# Patient Record
Sex: Male | Born: 1975
Health system: Southern US, Community
[De-identification: ages and names within clinical notes are randomized; demographics above are authoritative.]

## PROBLEM LIST (undated history)

## (undated) DIAGNOSIS — Z955 Presence of coronary angioplasty implant and graft: Secondary | ICD-10-CM

## (undated) DIAGNOSIS — E669 Obesity, unspecified: Secondary | ICD-10-CM

## (undated) DIAGNOSIS — I219 Acute myocardial infarction, unspecified: Secondary | ICD-10-CM

## (undated) DIAGNOSIS — F1910 Other psychoactive substance abuse, uncomplicated: Secondary | ICD-10-CM

## (undated) DIAGNOSIS — I2510 Atherosclerotic heart disease of native coronary artery without angina pectoris: Secondary | ICD-10-CM

## (undated) DIAGNOSIS — I5189 Other ill-defined heart diseases: Secondary | ICD-10-CM

## (undated) DIAGNOSIS — I10 Essential (primary) hypertension: Secondary | ICD-10-CM

## (undated) DIAGNOSIS — E785 Hyperlipidemia, unspecified: Secondary | ICD-10-CM

## (undated) HISTORY — DX: Hyperlipidemia, unspecified: E78.5

## (undated) HISTORY — DX: Other ill-defined heart diseases: I51.89

## (undated) HISTORY — DX: Acute myocardial infarction, unspecified: I21.9

## (undated) HISTORY — DX: Atherosclerotic heart disease of native coronary artery without angina pectoris: I25.10

---

## 2008-02-14 ENCOUNTER — Emergency Department: Payer: Self-pay | Admitting: Emergency Medicine

## 2008-02-14 ENCOUNTER — Other Ambulatory Visit: Payer: Self-pay

## 2009-03-11 IMAGING — CR DG CHEST 1V PORT
1 series · 1 of 1 positions shown · non-contrast
Comparison: none

REASON FOR EXAM: Chest pain
COMMENTS:

PROCEDURE:     DXR - DXR PORTABLE CHEST SINGLE VIEW  - February 14, 2008  [DATE]
RESULT:     Frontal view of the chest is performed.
The lungs are clear. The cardiac silhouette and visualized bony skeleton are
unremarkable.

[view not recorded]
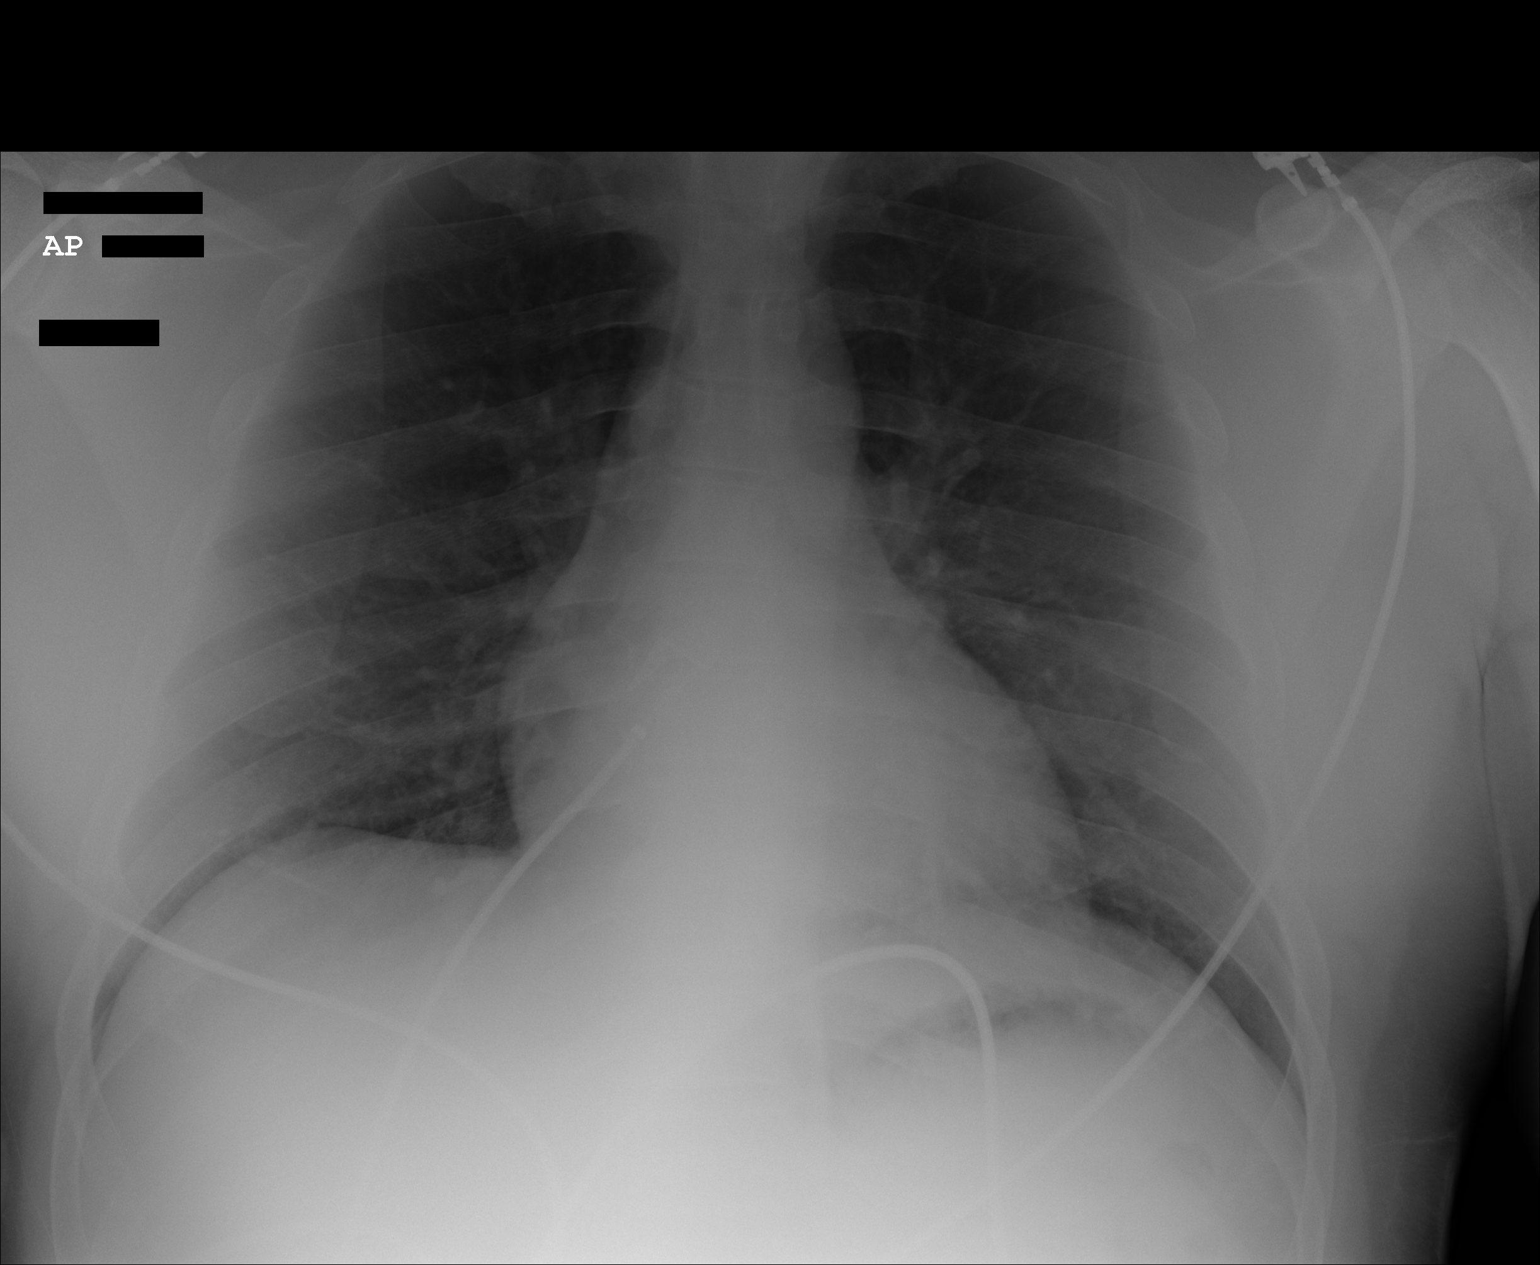

[1 of 1 positions shown; findings below may reference images not displayed]

IMPRESSION: Chest radiograph without evidence of acute cardiopulmonary
disease.

## 2015-08-18 DIAGNOSIS — I2510 Atherosclerotic heart disease of native coronary artery without angina pectoris: Secondary | ICD-10-CM

## 2015-08-18 HISTORY — DX: Atherosclerotic heart disease of native coronary artery without angina pectoris: I25.10

## 2015-08-25 ENCOUNTER — Emergency Department: Payer: Medicaid Other

## 2015-08-25 ENCOUNTER — Inpatient Hospital Stay
Admission: EM | Admit: 2015-08-25 | Discharge: 2015-08-28 | DRG: 246 | Disposition: A | Discharge status: Home / Self Care | Payer: Medicaid Other | Attending: Internal Medicine | Admitting: Internal Medicine

## 2015-08-25 ENCOUNTER — Encounter: Payer: Self-pay | Admitting: Emergency Medicine

## 2015-08-25 DIAGNOSIS — M549 Dorsalgia, unspecified: Secondary | ICD-10-CM | POA: Diagnosis present

## 2015-08-25 DIAGNOSIS — F17210 Nicotine dependence, cigarettes, uncomplicated: Secondary | ICD-10-CM | POA: Diagnosis present

## 2015-08-25 DIAGNOSIS — Z9114 Patient's other noncompliance with medication regimen: Secondary | ICD-10-CM | POA: Diagnosis present

## 2015-08-25 DIAGNOSIS — I2510 Atherosclerotic heart disease of native coronary artery without angina pectoris: Secondary | ICD-10-CM | POA: Diagnosis not present

## 2015-08-25 DIAGNOSIS — I10 Essential (primary) hypertension: Secondary | ICD-10-CM | POA: Diagnosis present

## 2015-08-25 DIAGNOSIS — F17200 Nicotine dependence, unspecified, uncomplicated: Secondary | ICD-10-CM | POA: Insufficient documentation

## 2015-08-25 DIAGNOSIS — G8929 Other chronic pain: Secondary | ICD-10-CM | POA: Diagnosis present

## 2015-08-25 DIAGNOSIS — F1290 Cannabis use, unspecified, uncomplicated: Secondary | ICD-10-CM | POA: Diagnosis present

## 2015-08-25 DIAGNOSIS — I25119 Atherosclerotic heart disease of native coronary artery with unspecified angina pectoris: Secondary | ICD-10-CM | POA: Diagnosis present

## 2015-08-25 DIAGNOSIS — Z823 Family history of stroke: Secondary | ICD-10-CM | POA: Diagnosis not present

## 2015-08-25 DIAGNOSIS — I214 Non-ST elevation (NSTEMI) myocardial infarction: Principal | ICD-10-CM | POA: Diagnosis present

## 2015-08-25 DIAGNOSIS — I209 Angina pectoris, unspecified: Secondary | ICD-10-CM | POA: Diagnosis not present

## 2015-08-25 DIAGNOSIS — R000 Tachycardia, unspecified: Secondary | ICD-10-CM | POA: Diagnosis present

## 2015-08-25 DIAGNOSIS — Z6833 Body mass index (BMI) 33.0-33.9, adult: Secondary | ICD-10-CM

## 2015-08-25 DIAGNOSIS — E669 Obesity, unspecified: Secondary | ICD-10-CM | POA: Diagnosis present

## 2015-08-25 DIAGNOSIS — R079 Chest pain, unspecified: Secondary | ICD-10-CM | POA: Diagnosis not present

## 2015-08-25 DIAGNOSIS — E785 Hyperlipidemia, unspecified: Secondary | ICD-10-CM | POA: Diagnosis present

## 2015-08-25 DIAGNOSIS — Z8249 Family history of ischemic heart disease and other diseases of the circulatory system: Secondary | ICD-10-CM | POA: Diagnosis not present

## 2015-08-25 DIAGNOSIS — Z720 Tobacco use: Secondary | ICD-10-CM | POA: Diagnosis not present

## 2015-08-25 HISTORY — DX: Other psychoactive substance abuse, uncomplicated: F19.10

## 2015-08-25 HISTORY — DX: Obesity, unspecified: E66.9

## 2015-08-25 HISTORY — DX: Essential (primary) hypertension: I10

## 2015-08-25 LAB — BASIC METABOLIC PANEL
ANION GAP: 11 (ref 5–15)
BUN: 17 mg/dL (ref 6–20)
CHLORIDE: 98 mmol/L — AB (ref 101–111)
CO2: 27 mmol/L (ref 22–32)
Calcium: 9.9 mg/dL (ref 8.9–10.3)
Creatinine, Ser: 1.37 mg/dL — ABNORMAL HIGH (ref 0.61–1.24)
GFR calc Af Amer: >60 mL/min (ref >60)
GFR calc non Af Amer: >60 mL/min (ref >60)
Glucose, Bld: 148 mg/dL — ABNORMAL HIGH (ref 65–99)
POTASSIUM: 3.6 mmol/L (ref 3.5–5.1)
SODIUM: 136 mmol/L (ref 135–145)

## 2015-08-25 LAB — CBC
HCT: 48.7 % (ref 40.0–52.0)
HEMOGLOBIN: 15.6 g/dL (ref 13.0–18.0)
MCH: 27.6 pg (ref 26.0–34.0)
MCHC: 32.2 g/dL (ref 32.0–36.0)
MCV: 85.9 fL (ref 80.0–100.0)
PLATELETS: 223 10*3/uL (ref 150–440)
RBC: 5.67 MIL/uL (ref 4.40–5.90)
RDW: 14.4 % (ref 11.5–14.5)
WBC: 6.6 10*3/uL (ref 3.8–10.6)

## 2015-08-25 LAB — PROTIME-INR
INR: 0.91
PROTHROMBIN TIME: 12.5 s (ref 11.4–15.0)

## 2015-08-25 LAB — TROPONIN I: TROPONIN I: 4.92 ng/mL — AB (ref <0.031)

## 2015-08-25 LAB — APTT: APTT: 28 s (ref 24–36)

## 2015-08-25 MED ORDER — ONDANSETRON HCL 4 MG/2ML IJ SOLN: 4.0000 mg | Freq: Four times a day (QID) | INTRAMUSCULAR | Status: DC | PRN

## 2015-08-25 MED ORDER — ASPIRIN 300 MG RE SUPP: 300.0000 mg | RECTAL | Status: DC

## 2015-08-25 MED ORDER — ASPIRIN 81 MG PO CHEW
324.0000 mg | CHEWABLE_TABLET | Freq: Once | ORAL | Status: AC
  Administered 2015-08-25: 324 mg via ORAL
  Filled 2015-08-25: qty 4

## 2015-08-25 MED ORDER — METOPROLOL TARTRATE 25 MG PO TABS
12.5000 mg | ORAL_TABLET | Freq: Two times a day (BID) | ORAL | Status: DC
  Administered 2015-08-26: 12.5 mg via ORAL
  Filled 2015-08-25: qty 1

## 2015-08-25 MED ORDER — ASPIRIN EC 81 MG PO TBEC: 81.0000 mg | DELAYED_RELEASE_TABLET | Freq: Every day | ORAL | Status: DC

## 2015-08-25 MED ORDER — ASPIRIN 81 MG PO CHEW: 324.0000 mg | CHEWABLE_TABLET | ORAL | Status: DC

## 2015-08-25 MED ORDER — ACETAMINOPHEN 325 MG PO TABS: 650.0000 mg | ORAL_TABLET | ORAL | Status: DC | PRN

## 2015-08-25 MED ORDER — MORPHINE SULFATE (PF) 2 MG/ML IV SOLN
2.0000 mg | INTRAVENOUS | Status: DC | PRN
  Administered 2015-08-26 – 2015-08-27 (×2): 2 mg via INTRAVENOUS
  Filled 2015-08-25: qty 1

## 2015-08-25 MED ORDER — HYDRALAZINE HCL 20 MG/ML IJ SOLN
10.0000 mg | INTRAMUSCULAR | Status: DC | PRN
  Administered 2015-08-26: 20 mg via INTRAVENOUS
  Administered 2015-08-27: 10 mg via INTRAVENOUS
  Filled 2015-08-25: qty 1

## 2015-08-25 MED ORDER — NITROGLYCERIN 2 % TD OINT
1.0000 [in_us] | TOPICAL_OINTMENT | Freq: Once | TRANSDERMAL | Status: AC
  Administered 2015-08-25: 1 [in_us] via TOPICAL
  Filled 2015-08-25: qty 1

## 2015-08-25 MED ORDER — HEPARIN (PORCINE) IN NACL 100-0.45 UNIT/ML-% IJ SOLN
1300.0000 [IU]/h | INTRAMUSCULAR | Status: DC
  Administered 2015-08-26: 1300 [IU]/h via INTRAVENOUS
  Filled 2015-08-25: qty 250

## 2015-08-25 MED ORDER — HEPARIN BOLUS VIA INFUSION
4000.0000 [IU] | Freq: Once | INTRAVENOUS | Status: AC
Start: 2015-08-25 — End: 2015-08-26
  Administered 2015-08-26: 4000 [IU] via INTRAVENOUS
  Filled 2015-08-25: qty 4000

## 2015-08-25 MED ORDER — ATORVASTATIN CALCIUM 20 MG PO TABS
80.0000 mg | ORAL_TABLET | Freq: Every day | ORAL | Status: DC
  Administered 2015-08-26 – 2015-08-27 (×2): 80 mg via ORAL
  Filled 2015-08-25 (×3): qty 4
  Filled 2015-08-25: qty 3

## 2015-08-25 MED ORDER — LABETALOL HCL 5 MG/ML IV SOLN
10.0000 mg | Freq: Once | INTRAVENOUS | Status: AC
  Administered 2015-08-25: 10 mg via INTRAVENOUS
  Filled 2015-08-25: qty 4

## 2015-08-25 MED ORDER — NITROGLYCERIN 0.4 MG SL SUBL: 0.4000 mg | SUBLINGUAL_TABLET | SUBLINGUAL | Status: DC | PRN

## 2015-08-25 NOTE — ED Notes (Signed)
Patient c/o left sided chest pain x 3 days with tingling down left arm. Patient reports has had intermittent chest discomfort with some shortness of breath and diaphoretic at times. Patient reports history of HTN but does not have a PCP nor medicine for blood pressure. Patient is current smoker. Denies headache, blurry vision, dizziness, N/V.  Patient alert and oriented x 4, respirations even and unlabored. Placed on cardiac monitor.

## 2015-08-25 NOTE — H&P (Signed)
[facility] - [place] at [facility]   PATIENT NAME: George Welch    MR#:  [id number]  DATE OF BIRTH:  January 22, 1976   DATE OF ADMISSION:  08/25/2015  PRIMARY CARE PHYSICIAN: No PCP Per Patient   REQUESTING/REFERRING PHYSICIAN: Carollee Massed  CHIEF COMPLAINT:   Chief Complaint  Patient presents with  . Chest Pain    HISTORY OF PRESENT ILLNESS:  George Welch  is a 39 y.o. male with a known history of essential hypertension noncompliant with medication presenting with chest pain. He describes chest pain intermittent over the last 3 days not exertional retrosternal without radiation, associated shortness of breath and diaphoresis intensity worsening or last 3 days thus prompting presentation at the hospital. Noted to be hypertensive on arrival as well as markedly elevated troponin.  PAST MEDICAL HISTORY:   Past Medical History  Diagnosis Date  . Hypertension     PAST SURGICAL HISTORY:  History reviewed. No pertinent past surgical history.  SOCIAL HISTORY:   Social History  Substance Use Topics  . Smoking status: Current Every Day Smoker  . Smokeless tobacco: Not on file  . Alcohol Use: Not on file    FAMILY HISTORY:   Family History  Problem Relation Age of Onset  . Heart failure Neg Hx     DRUG ALLERGIES:  No Known Allergies  REVIEW OF SYSTEMS:  REVIEW OF SYSTEMS:  CONSTITUTIONAL: Denies fevers, chills, fatigue, weakness.  EYES: Denies blurred vision, double vision, or eye pain.  EARS, NOSE, THROAT: Denies tinnitus, ear pain, hearing loss.  RESPIRATORY: denies cough, positive shortness of breath, denies wheezing  CARDIOVASCULAR: Positive chest pain, denies palpitations, edema.  GASTROINTESTINAL: Denies nausea, vomiting, diarrhea, abdominal pain.  GENITOURINARY: Denies dysuria, hematuria.  ENDOCRINE: Denies nocturia or thyroid problems. HEMATOLOGIC AND LYMPHATIC: Denies easy bruising or bleeding.  SKIN: Denies rash or lesions.   MUSCULOSKELETAL: Denies pain in neck, back, shoulder, knees, hips, or further arthritic symptoms.  NEUROLOGIC: Denies paralysis, paresthesias.  PSYCHIATRIC: Denies anxiety or depressive symptoms. Otherwise full review of systems performed by me is negative.   MEDICATIONS AT HOME:   Prior to Admission medications   Not on File      VITAL SIGNS:  Blood pressure 154/109, pulse 98, temperature 99.2 F (37.3 C), temperature source Oral, resp. rate 20, height 5\' 11"  (1.803 m), weight 241 lb (109.317 kg), SpO2 97 %.  PHYSICAL EXAMINATION:  VITAL SIGNS: Filed Vitals:   08/25/15 2315  BP: 154/109  Pulse: 98  Temp:   Resp: 20   GENERAL:39 y.o.male currently in no acute distress.  HEAD: Normocephalic, atraumatic.  EYES: Pupils equal, round, reactive to light. Extraocular muscles intact. No scleral icterus.  MOUTH: Moist mucosal membrane. Dentition intact. No abscess noted.  EAR, NOSE, THROAT: Clear without exudates. No external lesions.  NECK: Supple. No thyromegaly. No nodules. No JVD.  PULMONARY: Clear to ascultation, without wheeze rails or rhonci. No use of accessory muscles, Good respiratory effort. good air entry bilaterally CHEST: Nontender to palpation.  CARDIOVASCULAR: S1 and S2. Regular rate and rhythm. No murmurs, rubs, or gallops. No edema. Pedal pulses 2+ bilaterally.  GASTROINTESTINAL: Soft, nontender, nondistended. No masses. Positive bowel sounds. No hepatosplenomegaly.  MUSCULOSKELETAL: No swelling, clubbing, or edema. Range of motion full in all extremities.  NEUROLOGIC: Cranial nerves II through XII are intact. No gross focal neurological deficits. Sensation intact. Reflexes intact.  SKIN: No ulceration, lesions, rashes, or cyanosis. Skin warm and dry. Turgor intact.  PSYCHIATRIC: Mood, affect within normal limits.  The patient is awake, alert and oriented x 3. Insight, judgment intact.    LABORATORY PANEL:   CBC  Recent Labs Lab 08/25/15 2131  WBC 6.6  HGB  15.6  HCT 48.7  PLT 223   ------------------------------------------------------------------------------------------------------------------  Chemistries   Recent Labs Lab 08/25/15 2131  NA 136  K 3.6  CL 98*  CO2 27  GLUCOSE 148*  BUN 17  CREATININE 1.37*  CALCIUM 9.9   ------------------------------------------------------------------------------------------------------------------  Cardiac Enzymes  Recent Labs Lab 08/25/15 2131  TROPONINI 4.92*   ------------------------------------------------------------------------------------------------------------------  RADIOLOGY:  Dg Chest Port 1 View  08/25/2015   CLINICAL DATA:  Chest pain for 2 days.  Shortness of breath.  EXAM: PORTABLE CHEST - 1 VIEW  COMPARISON:  None.  FINDINGS: The heart size and mediastinal contours are within normal limits. Both lungs are clear. The visualized skeletal structures are unremarkable.  IMPRESSION: No active disease.   Electronically Signed   By: Burman Nieves M.D.   On: 08/25/2015 22:48    EKG:   Orders placed or performed during the hospital encounter of 08/25/15  . ED EKG  . ED EKG    IMPRESSION AND PLAN:   39 year old African-American gentleman history of essential hypertension presenting with chest pain.  1. NSTEMI:  Initiate aspirin and statin therapy, admitted to telemetry, trend cardiac enzymes 3, heparin drip ,nitroglycerin when necessary, morphine when necessary, consult cardiology  2. Essential hypertension: Add when necessary hydralazine, as her received 1 dose of IV labetalol 3. Venous from embolism prophylactic: Therapeutic heparin    All the records are reviewed and case discussed with ED provider. Management plans discussed with the patient, family and they are in agreement.  CODE STATUS: Full  TOTAL TIME TAKING CARE OF THIS PATIENT: 45 minutes.    Bridget Westbrooks,  Mardi Mainland.D on 08/25/2015 at 11:31 PM  Between 7am to 6pm - Pager - [phone]  After 6pm: House  Pager: - [phone]  [facility]  Office  [phone]  CC: Primary care physician; No PCP Per Patient

## 2015-08-25 NOTE — ED Notes (Signed)
MD at bedside. 

## 2015-08-25 NOTE — ED Notes (Signed)
Chest pain x 2 days, has has cp, shob along with it.

## 2015-08-25 NOTE — ED Provider Notes (Signed)
[facility] Emergency Department Provider Note  ____________________________________________  Time seen: 22 15  I have reviewed the triage vital signs and the nursing notes.   HISTORY  Chief Complaint Chest Pain     HPI George Welch is a 39 y.o. male with a history of untreated hypertension presents to the emergency department reporting chest discomfort for the past 2-3 days.  This pain has been in the mid chest with some radiation and funny feelings into his left arm. He reports some shortness of breath and some episodes of diaphoresis over the past 2 days.  As noted above, he has a history of untreated hypertension. He reports having had similar episodes of chest discomfort in the past and he has been seen in the emergency department and told that it was his blood pressure. The last time this occurred was greater than a year ago. He has no known history of any myocardial infarctions.   The patient does smoke cigarettes. He denies any drug use, including any use of crack or cocaine.    Past Medical History  Diagnosis Date  . Hypertension     Past medical history Hypertension  History reviewed. No pertinent past surgical history.  No current outpatient prescriptions on file.  Allergies Review of patient's allergies indicates no known allergies.  No family history on file.  Social History Social History  Substance Use Topics  . Smoking status: Current Every Day Smoker  . Smokeless tobacco: None  . Alcohol Use: None    Review of Systems  Constitutional: Negative for fever. ENT: Negative for sore throat. Cardiovascular:  Positive for chest pain. Respiratory:  Some shortness of breath. Gastrointestinal: Negative for abdominal pain, vomiting and diarrhea. Genitourinary: Negative for dysuria. Musculoskeletal: No myalgias or injuries. Skin:  Diaphoresis noted. Otherwise negative. Neurological: Negative for headaches   10-point ROS  otherwise negative.  ____________________________________________   PHYSICAL EXAM:  VITAL SIGNS: ED Triage Vitals  Enc Vitals Group     BP 08/25/15 2128 163/104 mmHg     Pulse --      Resp 08/25/15 2128 111     Temp 08/25/15 2128 99.2 F (37.3 C)     Temp Source 08/25/15 2128 Oral     SpO2 08/25/15 2128 97 %     Weight 08/25/15 2128 241 lb (109.317 kg)     Height 08/25/15 2128  (1.803 m)     Head Cir --      Peak Flow --      Pain Score 08/25/15 2128 0     Pain Loc --      Pain Edu? --      Excl. in GC? --     Constitutional:  Alert and oriented. Well appearing and in no distress. ENT   Head: Normocephalic and atraumatic.   Nose: No congestion/rhinnorhea.   Mouth/Throat: Mucous membranes are moist. Cardiovascular: Tachycardic at 105. regular rhythm, no murmur noted Respiratory:  Normal respiratory effort, no tachypnea.    Breath sounds are clear and equal bilaterally.  Gastrointestinal: Soft and nontender. No distention.  Back: No muscle spasm, no tenderness, no CVA tenderness. Musculoskeletal: No deformity noted. Nontender with normal range of motion in all extremities.  No noted edema. Neurologic:  Normal speech and language. No gross focal neurologic deficits are appreciated.  Skin:  Skin is warm, moist with mild diaphoresis. No rash noted. Psychiatric: Mood and affect are normal. Speech and behavior are normal.  ____________________________________________    LABS (pertinent positives/negatives)  Labs Reviewed  BASIC METABOLIC PANEL - Abnormal; Notable for the following:    Chloride 98 (*)    Glucose, Bld 148 (*)    Creatinine, Ser 1.37 (*)    All other components within normal limits  TROPONIN I - Abnormal; Notable for the following:    Troponin I 4.92 (*)    All other components within normal limits  CBC  APTT  PROTIME-INR     ____________________________________________   EKG  ED ECG REPORT I, Laden Fieldhouse W, the attending  physician, personally viewed and interpreted this ECG.   Date: 08/25/2015  EKG Time: 2127  Rate: 103  Rhythm: Sinus tachycardia Minimal voltage criteria for left ventricular hypertrophy. Q in lead 3 and aVF  Axis: Left axis at -21  Intervals: Normal  ST&T Change: None noted   ____________________________________________    RADIOLOGY  Chest x-ray:  FINDINGS: The heart size and mediastinal contours are within normal limits. Both lungs are clear. The visualized skeletal structures are unremarkable.  IMPRESSION: No active disease. ____________________________________________   PROCEDURES  CRITICAL CARE Performed by: Darien Ramus   Total critical care time: 35 minutes due to the critical nature of this patient's subacute or acute myocardial infarction, requiring anticoagulant, IV blood pressure medication, discussion with cardiology and with internal medicine, and discussion with the patient's wife by telephone.  Critical care time was exclusive of separately billable procedures and treating other patients.  Critical care was necessary to treat or prevent imminent or life-threatening deterioration.  Critical care was time spent personally by me on the following activities: development of treatment plan with patient and/or surrogate as well as nursing, discussions with consultants, evaluation of patient's response to treatment, examination of patient, obtaining history from patient or surrogate, ordering and performing treatments and interventions, ordering and review of laboratory studies, ordering and review of radiographic studies, pulse oximetry and re-evaluation of patient's condition.   ____________________________________________   INITIAL IMPRESSION / ASSESSMENT AND PLAN / ED COURSE  Pertinent labs & imaging results that were available during my care of the patient were reviewed by me and considered in my medical decision making (see chart for  details).  39 year old male with chest discomfort, now noted to have an elevated troponin at 4.9. This patient's EKG does not show any specific acute changes, it is not a STEMI. I have called and spoken with Dr. Elease Hashimoto, cardiology, and he agrees with my current actions to treat with heparin, aspirin, or blocker, and nitroglycerin. The patient will be admitted to the hospital.  After speaking with the patient, and with his consent, I've called and spoken with the patient's wife by telephone. She reports she will return to the hospital send.  ----------------------------------------- 10:46 PM on 08/25/2015 -----------------------------------------  I spoke with Dr. Clint Guy for admission to the hospital. ____________________________________________   FINAL CLINICAL IMPRESSION(S) / ED DIAGNOSES  Final diagnoses:  Ischemic chest pain  Acute non-ST-elevation myocardial infarction  Essential hypertension      Darien Ramus, MD 08/25/15 2258

## 2015-08-25 NOTE — Progress Notes (Signed)
ANTICOAGULATION CONSULT NOTE - Initial Consult  Pharmacy Consult for heparin drip Indication: ACS/STEMI  No Known Allergies  Patient Measurements: Height:  (180.3 cm) Weight: 241 lb (109.317 kg) IBW/kg (Calculated) : 75.3 Heparin Dosing Weight: 99kg  Vital Signs: Temp: 99.2 F (37.3 C) (09/08 2128) Temp Source: Oral (09/08 2128) BP: 175/106 mmHg (09/08 2230) Pulse Rate: 109 (09/08 2230)  Labs:  Recent Labs  08/25/15 2131  HGB 15.6  HCT 48.7  PLT 223  APTT 28  LABPROT 12.5  INR 0.91  CREATININE 1.37*  TROPONINI 4.92*    Estimated Creatinine Clearance: 91 mL/min (by C-G formula based on Cr of 1.37).   Medical History: Past Medical History  Diagnosis Date  . Hypertension     Medications:    Assessment: Hgb 15.6  plt 223 INR 0.91  aPTT 28  Goal of Therapy:  Heparin level 0.3-0.7 units/ml Monitor platelets by anticoagulation protocol: Yes   Plan:  4000 unit bolus and initial rate of 1300 units/hr. First anti-Xa 6 hours after start of infusion.  George Welch 08/25/2015,11:21 PM

## 2015-08-26 ENCOUNTER — Inpatient Hospital Stay: Admit: 2015-08-26 | Payer: Medicaid Other

## 2015-08-26 ENCOUNTER — Inpatient Hospital Stay (HOSPITAL_COMMUNITY)
Admit: 2015-08-26 | Discharge: 2015-08-26 | Disposition: A | Discharge status: Home / Self Care | Payer: Medicaid Other | Attending: Internal Medicine | Admitting: Internal Medicine

## 2015-08-26 ENCOUNTER — Encounter
Admission: EM | Disposition: A | Discharge status: Home / Self Care | Payer: Self-pay | Source: Home / Self Care | Attending: Internal Medicine

## 2015-08-26 ENCOUNTER — Encounter: Payer: Self-pay | Admitting: Internal Medicine

## 2015-08-26 DIAGNOSIS — F17200 Nicotine dependence, unspecified, uncomplicated: Secondary | ICD-10-CM | POA: Insufficient documentation

## 2015-08-26 DIAGNOSIS — I214 Non-ST elevation (NSTEMI) myocardial infarction: Secondary | ICD-10-CM | POA: Insufficient documentation

## 2015-08-26 DIAGNOSIS — I2510 Atherosclerotic heart disease of native coronary artery without angina pectoris: Secondary | ICD-10-CM

## 2015-08-26 DIAGNOSIS — Z720 Tobacco use: Secondary | ICD-10-CM

## 2015-08-26 DIAGNOSIS — I209 Angina pectoris, unspecified: Secondary | ICD-10-CM | POA: Insufficient documentation

## 2015-08-26 DIAGNOSIS — R079 Chest pain, unspecified: Secondary | ICD-10-CM

## 2015-08-26 DIAGNOSIS — I10 Essential (primary) hypertension: Secondary | ICD-10-CM | POA: Insufficient documentation

## 2015-08-26 HISTORY — PX: CARDIAC CATHETERIZATION: SHX172

## 2015-08-26 LAB — CBC
HEMATOCRIT: 47 % (ref 40.0–52.0)
HEMOGLOBIN: 15.5 g/dL (ref 13.0–18.0)
MCH: 28.2 pg (ref 26.0–34.0)
MCHC: 33 g/dL (ref 32.0–36.0)
MCV: 85.6 fL (ref 80.0–100.0)
Platelets: 211 10*3/uL (ref 150–440)
RBC: 5.49 MIL/uL (ref 4.40–5.90)
RDW: 14.4 % (ref 11.5–14.5)
WBC: 6.9 10*3/uL (ref 3.8–10.6)

## 2015-08-26 LAB — LIPID PANEL
CHOL/HDL RATIO: 7.4 ratio
CHOLESTEROL: 250 mg/dL — AB (ref 0–200)
HDL: 34 mg/dL — ABNORMAL LOW (ref >40)
LDL CALC: 159 mg/dL — AB (ref 0–99)
Triglycerides: 283 mg/dL — ABNORMAL HIGH (ref <150)
VLDL: 57 mg/dL — AB (ref 0–40)

## 2015-08-26 LAB — URINE DRUG SCREEN, QUALITATIVE (ARMC ONLY)
AMPHETAMINES, UR SCREEN: NOT DETECTED
Barbiturates, Ur Screen: NOT DETECTED
Benzodiazepine, Ur Scrn: POSITIVE — AB
COCAINE METABOLITE, UR ~~LOC~~: NOT DETECTED
Cannabinoid 50 Ng, Ur ~~LOC~~: POSITIVE — AB
MDMA (ECSTASY) UR SCREEN: NOT DETECTED
Methadone Scn, Ur: NOT DETECTED
OPIATE, UR SCREEN: NOT DETECTED
PHENCYCLIDINE (PCP) UR S: NOT DETECTED
Tricyclic, Ur Screen: NOT DETECTED

## 2015-08-26 LAB — BASIC METABOLIC PANEL
Anion gap: 12 (ref 5–15)
BUN: 15 mg/dL (ref 6–20)
CHLORIDE: 100 mmol/L — AB (ref 101–111)
CO2: 26 mmol/L (ref 22–32)
Calcium: 9.6 mg/dL (ref 8.9–10.3)
Creatinine, Ser: 1.15 mg/dL (ref 0.61–1.24)
GFR calc Af Amer: >60 mL/min (ref >60)
GLUCOSE: 110 mg/dL — AB (ref 65–99)
POTASSIUM: 3.6 mmol/L (ref 3.5–5.1)
Sodium: 138 mmol/L (ref 135–145)

## 2015-08-26 LAB — TROPONIN I
TROPONIN I: 14.43 ng/mL — AB (ref <0.031)
Troponin I: 5.5 ng/mL — ABNORMAL HIGH (ref <0.031)
Troponin I: 6.46 ng/mL — ABNORMAL HIGH (ref <0.031)

## 2015-08-26 LAB — HEMOGLOBIN A1C: HEMOGLOBIN A1C: 5.7 % (ref 4.0–6.0)

## 2015-08-26 LAB — HEPARIN LEVEL (UNFRACTIONATED): Heparin Unfractionated: 0.59 IU/mL (ref 0.30–0.70)

## 2015-08-26 LAB — MRSA PCR SCREENING: MRSA by PCR: NEGATIVE

## 2015-08-26 SURGERY — LEFT HEART CATH AND CORONARY ANGIOGRAPHY
Anesthesia: Moderate Sedation

## 2015-08-26 MED ORDER — NICOTINE 21 MG/24HR TD PT24
21.0000 mg | MEDICATED_PATCH | Freq: Every day | TRANSDERMAL | Status: DC
  Administered 2015-08-26: 21 mg via TRANSDERMAL
  Filled 2015-08-26 (×4): qty 1

## 2015-08-26 MED ORDER — NITROGLYCERIN 5 MG/ML IV SOLN
INTRAVENOUS | Status: AC
  Filled 2015-08-26: qty 10

## 2015-08-26 MED ORDER — ASPIRIN 81 MG PO CHEW
81.0000 mg | CHEWABLE_TABLET | Freq: Every day | ORAL | Status: DC
  Administered 2015-08-27 – 2015-08-28 (×2): 81 mg via ORAL
  Filled 2015-08-26 (×2): qty 1

## 2015-08-26 MED ORDER — TICAGRELOR 90 MG PO TABS
ORAL_TABLET | ORAL | Status: DC | PRN
  Administered 2015-08-26: 180 mg via ORAL

## 2015-08-26 MED ORDER — SODIUM CHLORIDE 0.9 % IJ SOLN: 3.0000 mL | Freq: Two times a day (BID) | INTRAMUSCULAR | Status: DC

## 2015-08-26 MED ORDER — ASPIRIN 81 MG PO CHEW
CHEWABLE_TABLET | ORAL | Status: AC
  Filled 2015-08-26: qty 4

## 2015-08-26 MED ORDER — LORAZEPAM 2 MG/ML IJ SOLN: 1.0000 mg | INTRAMUSCULAR | Status: DC | PRN

## 2015-08-26 MED ORDER — SODIUM CHLORIDE 0.9 % IJ SOLN: 3.0000 mL | INTRAMUSCULAR | Status: DC | PRN

## 2015-08-26 MED ORDER — BIVALIRUDIN 250 MG IV SOLR
INTRAVENOUS | Status: AC
Start: 2015-08-26 — End: 2015-08-26
  Filled 2015-08-26: qty 250

## 2015-08-26 MED ORDER — METOPROLOL TARTRATE 1 MG/ML IV SOLN
INTRAVENOUS | Status: AC
  Administered 2015-08-26: 5 mg
  Filled 2015-08-26: qty 5

## 2015-08-26 MED ORDER — FENTANYL CITRATE (PF) 100 MCG/2ML IJ SOLN
INTRAMUSCULAR | Status: DC | PRN
  Administered 2015-08-26: 50 ug via INTRAVENOUS

## 2015-08-26 MED ORDER — MIDAZOLAM HCL 2 MG/2ML IJ SOLN
INTRAMUSCULAR | Status: AC
  Filled 2015-08-26: qty 2

## 2015-08-26 MED ORDER — TICAGRELOR 90 MG PO TABS
ORAL_TABLET | ORAL | Status: AC
  Filled 2015-08-26: qty 2

## 2015-08-26 MED ORDER — BIVALIRUDIN BOLUS VIA INFUSION - CUPID
INTRAVENOUS | Status: DC | PRN
  Administered 2015-08-26: 81.975 mg via INTRAVENOUS

## 2015-08-26 MED ORDER — BIVALIRUDIN 250 MG IV SOLR
250.0000 mg | INTRAVENOUS | Status: DC | PRN
  Administered 2015-08-26: 1.75 mg/kg/h via INTRAVENOUS

## 2015-08-26 MED ORDER — BIVALIRUDIN 250 MG IV SOLR
INTRAVENOUS | Status: AC
  Filled 2015-08-26: qty 250

## 2015-08-26 MED ORDER — ASPIRIN 81 MG PO CHEW
CHEWABLE_TABLET | ORAL | Status: DC | PRN
  Administered 2015-08-26: 324 mg via ORAL

## 2015-08-26 MED ORDER — ONDANSETRON HCL 4 MG/2ML IJ SOLN: 4.0000 mg | Freq: Four times a day (QID) | INTRAMUSCULAR | Status: DC | PRN

## 2015-08-26 MED ORDER — SODIUM CHLORIDE 0.9 % IV SOLN
250.0000 mL | INTRAVENOUS | Status: DC | PRN
Start: 2015-08-26 — End: 2015-08-26

## 2015-08-26 MED ORDER — IOHEXOL 300 MG/ML  SOLN
INTRAMUSCULAR | Status: DC | PRN
  Administered 2015-08-26: 80 mL via INTRA_ARTERIAL

## 2015-08-26 MED ORDER — MORPHINE SULFATE (PF) 2 MG/ML IV SOLN
INTRAVENOUS | Status: AC
  Administered 2015-08-26: 2 mg via INTRAVENOUS
  Filled 2015-08-26: qty 1

## 2015-08-26 MED ORDER — HYDRALAZINE HCL 20 MG/ML IJ SOLN
INTRAMUSCULAR | Status: AC
  Filled 2015-08-26: qty 1

## 2015-08-26 MED ORDER — HEPARIN (PORCINE) IN NACL 2-0.9 UNIT/ML-% IJ SOLN
INTRAMUSCULAR | Status: AC
  Filled 2015-08-26: qty 1000

## 2015-08-26 MED ORDER — ASPIRIN 81 MG PO CHEW: CHEWABLE_TABLET | ORAL | Status: DC | PRN

## 2015-08-26 MED ORDER — METOPROLOL TARTRATE 50 MG PO TABS
50.0000 mg | ORAL_TABLET | Freq: Two times a day (BID) | ORAL | Status: DC
  Administered 2015-08-26 – 2015-08-28 (×5): 50 mg via ORAL
  Filled 2015-08-26 (×5): qty 1

## 2015-08-26 MED ORDER — SODIUM CHLORIDE 0.9 % IV SOLN: 250.0000 mL | INTRAVENOUS | Status: DC | PRN

## 2015-08-26 MED ORDER — ACETAMINOPHEN 325 MG PO TABS: 650.0000 mg | ORAL_TABLET | ORAL | Status: DC | PRN

## 2015-08-26 MED ORDER — LORAZEPAM 2 MG/ML IJ SOLN
0.5000 mg | INTRAMUSCULAR | Status: DC | PRN
  Administered 2015-08-26: 0.5 mg via INTRAVENOUS
  Filled 2015-08-26: qty 0.25

## 2015-08-26 MED ORDER — FUROSEMIDE 10 MG/ML IJ SOLN
INTRAMUSCULAR | Status: AC
  Administered 2015-08-26: 40 mg
  Filled 2015-08-26: qty 4

## 2015-08-26 MED ORDER — SODIUM CHLORIDE 0.9 % WEIGHT BASED INFUSION: 3.0000 mL/kg/h | INTRAVENOUS | Status: DC

## 2015-08-26 MED ORDER — SODIUM CHLORIDE 0.9 % WEIGHT BASED INFUSION
3.0000 mL/kg/h | INTRAVENOUS | Status: DC
  Administered 2015-08-26: 3 mL/kg/h via INTRAVENOUS

## 2015-08-26 MED ORDER — SODIUM CHLORIDE 0.9 % WEIGHT BASED INFUSION: 1.0000 mL/kg/h | INTRAVENOUS | Status: DC

## 2015-08-26 MED ORDER — TICAGRELOR 90 MG PO TABS
90.0000 mg | ORAL_TABLET | Freq: Two times a day (BID) | ORAL | Status: DC
  Administered 2015-08-26 – 2015-08-28 (×4): 90 mg via ORAL
  Filled 2015-08-26 (×4): qty 1

## 2015-08-26 MED ORDER — MIDAZOLAM HCL 2 MG/2ML IJ SOLN
INTRAMUSCULAR | Status: DC | PRN
  Administered 2015-08-26 (×2): 1 mg via INTRAVENOUS

## 2015-08-26 MED ORDER — ASPIRIN 81 MG PO CHEW: 81.0000 mg | CHEWABLE_TABLET | ORAL | Status: DC

## 2015-08-26 MED ORDER — FENTANYL CITRATE (PF) 100 MCG/2ML IJ SOLN
INTRAMUSCULAR | Status: AC
  Filled 2015-08-26: qty 2

## 2015-08-26 SURGICAL SUPPLY — 22 items
BALLN TREK RX 2.5X20 (BALLOONS) ×3
BALLOON TREK RX 2.5X20 (BALLOONS) ×1 IMPLANT
CATH INFINITI 5FR ANG PIGTAIL (CATHETERS) ×3 IMPLANT
CATH INFINITI 5FR JL4 (CATHETERS) ×3 IMPLANT
CATH INFINITI JR4 5F (CATHETERS) ×3 IMPLANT
CATH VISTA GUIDE 6FR JR4 SH (CATHETERS) ×3 IMPLANT
CATH VISTA GUIDE 6FR XB3.5 SH (CATHETERS) ×3 IMPLANT
DEVICE CLOSURE MYNXGRIP 6/7F (Vascular Products) ×3 IMPLANT
DEVICE INFLAT 30 PLUS (MISCELLANEOUS) ×3 IMPLANT
DEVICE SAFEGUARD 24CM (GAUZE/BANDAGES/DRESSINGS) ×3 IMPLANT
KIT MANI 3VAL PERCEP (MISCELLANEOUS) ×3 IMPLANT
NEEDLE PERC 18GX7CM (NEEDLE) ×3 IMPLANT
NEEDLE SMART 18G ACCESS (NEEDLE) ×3 IMPLANT
PACK CARDIAC CATH (CUSTOM PROCEDURE TRAY) ×3 IMPLANT
SHEATH AVANTI 5FR X 11CM (SHEATH) ×3 IMPLANT
SHEATH AVANTI 6FR X 11CM (SHEATH) ×3 IMPLANT
STENT XIENCE ALPINE RX 2.5X23 (Permanent Stent) ×3 IMPLANT
STENT XIENCE ALPINE RX 2.75X28 (Permanent Stent) ×3 IMPLANT
STENT XIENCE ALPINE RX 3.0X23 (Permanent Stent) ×3 IMPLANT
STENT XIENCE ALPINE RX 3.0X33 (Permanent Stent) ×3 IMPLANT
WIRE EMERALD 3MM-J .035X150CM (WIRE) ×3 IMPLANT
WIRE G HI TQ BMW 190 (WIRE) ×6 IMPLANT

## 2015-08-26 NOTE — Plan of Care (Signed)
Problem: Consults Goal: Tobacco Cessation referral if indicated Outcome: Progressing Patient stated that he was a current smoker, but has plans to quit after knowing his diagnosis.

## 2015-08-26 NOTE — Progress Notes (Signed)
ANTICOAGULATION CONSULT NOTE -Follow Up  Pharmacy Consult for heparin drip Indication: ACS/STEMI  No Known Allergies  Patient Measurements: Height:  (180.3 cm) Weight: 241 lb (109.317 kg) IBW/kg (Calculated) : 75.3 Heparin Dosing Weight: 99kg  Vital Signs: Temp: 98.3 F (36.8 C) (09/09 0521) Temp Source: Oral (09/09 0521) BP: 124/86 mmHg (09/09 0521) Pulse Rate: 84 (09/09 0521)  Labs:  Recent Labs  08/25/15 2131 08/26/15 0406 08/26/15 0746 08/26/15 0748  HGB 15.6  --  15.5  --   HCT 48.7  --  47.0  --   PLT 223  --  211  --   APTT 28  --   --   --   LABPROT 12.5  --   --   --   INR 0.91  --   --   --   HEPARINUNFRC  --   --  0.59  --   CREATININE 1.37* 1.15  --   --   TROPONINI 4.92* 5.50*  --  6.46*    Estimated Creatinine Clearance: 108.4 mL/min (by C-G formula based on Cr of 1.15).   Medical History: Past Medical History  Diagnosis Date  . Hypertension     a. poorly controlled  . Obesity   . Drug abuse     a. THC    Medications:  Heparin Drip at 1300 units/hr   Assessment: Pharmacy consulted to initiate and titrate heparin drip in a 39 yo for ACS.  Hgb 15.5  plt 211 HL at 0746: 0.59  Goal of Therapy:  Heparin level 0.3-0.7 units/ml Monitor platelets by anticoagulation protocol: Yes   Plan:  HL within desired range of 0.3-0.7.  Will continue current drip rate and check a confirmatory level 6 hours later at 1330.  Pharmacy will continue to follow.  Markea Ruzich G 08/26/2015,8:34 AM

## 2015-08-26 NOTE — Progress Notes (Signed)
CRITICAL VALUE ALERT  Critical value received:  Trop 14.43  Date of notification:  08/26/2015  Time of notification:  1630  Critical value read back:Yes.    Nurse who received alert:  Rod Mae  MD notified (1st page):  Gollan  Time of first page:  1635  MD notified (2nd page):  Time of second page:  Responding MD:  Mariah Milling  Time MD responded:  *1640

## 2015-08-26 NOTE — Care Management (Addendum)
It was discussed during progression that patient has no payor and stopped taking meds due to lack of insurance.  CM from 2A made attempt to speak with patient but was off the unit.  Found that he was transferred to ICU.

## 2015-08-26 NOTE — Progress Notes (Signed)
MD/N Gollan, pt cont with oozing from Right Groin site, Fem-Stop in place, will cont to monitor, no new orders received

## 2015-08-26 NOTE — Plan of Care (Signed)
Problem: Phase I Progression Outcomes Goal: Anginal pain relieved Outcome: Completed/Met Date Met:  08/26/15 Patient verbalized that he does not have any pain

## 2015-08-26 NOTE — OR Nursing (Signed)
Patient agitated "needing to smoke" pressure elevated MD paged

## 2015-08-26 NOTE — Progress Notes (Addendum)
[facility] [place] [facility]   PATIENT NAME: George Welch    MR#:  [id number]  DATE OF BIRTH:  1976/01/11  SUBJECTIVE:  Came in with cp radiating to arm with diaphoresis. Found to have elevated troponin  REVIEW OF SYSTEMS:   Review of Systems  Constitutional: Negative for fever, chills and weight loss.  HENT: Negative for ear discharge, ear pain and nosebleeds.   Eyes: Negative for blurred vision, pain and discharge.  Respiratory: Negative for sputum production, shortness of breath, wheezing and stridor.   Cardiovascular: Positive for chest pain. Negative for palpitations, orthopnea and PND.  Gastrointestinal: Negative for nausea, vomiting, abdominal pain and diarrhea.  Genitourinary: Negative for urgency and frequency.  Musculoskeletal: Negative for back pain and joint pain.  Neurological: Negative for sensory change, speech change, focal weakness and weakness.  Psychiatric/Behavioral: Negative for depression and hallucinations. The patient is not nervous/anxious.   All other systems reviewed and are negative.  Tolerating Diet: npo  DRUG ALLERGIES:  No Known Allergies  VITALS:  Blood pressure 147/93, pulse 111, temperature 98.1 F (36.7 C), temperature source Oral, resp. rate 17, height  (1.803 m), weight 109.317 kg (241 lb), SpO2 100 %.  PHYSICAL EXAMINATION:   Physical Exam  GENERAL:  39 y.o.-year-old patient lying in the bed with no acute distress.  EYES: Pupils equal, round, reactive to light and accommodation. No scleral icterus. Extraocular muscles intact.  HEENT: Head atraumatic, normocephalic. Oropharynx and nasopharynx clear.  NECK:  Supple, no jugular venous distention. No thyroid enlargement, no tenderness.  LUNGS: Normal breath sounds bilaterally, no wheezing, rales, rhonchi. No use of accessory muscles of respiration.  CARDIOVASCULAR: S1, S2 normal. No murmurs, rubs, or gallops.  ABDOMEN: Soft, nontender, nondistended.  Bowel sounds present. No organomegaly or mass.  EXTREMITIES: No cyanosis, clubbing or edema b/l.    NEUROLOGIC: Cranial nerves II through XII are intact. No focal Motor or sensory deficits b/l.   PSYCHIATRIC: The patient is alert and oriented x 3.  SKIN: No obvious rash, lesion, or ulcer.    LABORATORY PANEL:   CBC  Recent Labs Lab 08/26/15 0746  WBC 6.9  HGB 15.5  HCT 47.0  PLT 211    Chemistries   Recent Labs Lab 08/26/15 0406  NA 138  K 3.6  CL 100*  CO2 26  GLUCOSE 110*  BUN 15  CREATININE 1.15  CALCIUM 9.6    Cardiac Enzymes  Recent Labs Lab 08/26/15 0748  TROPONINI 6.46*    RADIOLOGY:  Dg Chest Port 1 View  08/25/2015   CLINICAL DATA:  Chest pain for 2 days.  Shortness of breath.  EXAM: PORTABLE CHEST - 1 VIEW  COMPARISON:  None.  FINDINGS: The heart size and mediastinal contours are within normal limits. Both lungs are clear. The visualized skeletal structures are unremarkable.  IMPRESSION: No active disease.   Electronically Signed   By: Burman Nieves M.D.   On: 08/25/2015 22:48     ASSESSMENT AND PLAN:    39 year old African-American gentleman history of essential hypertension presenting with chest pain.  1. Acute NSTEMI: - Initiate aspirin and statin therapy, heparin drip ,nitroglycerin when necessary, morphine when necessary, -consult with cardiology noted. Pt is going to be getting cath today -cont BB,ASA,STATINS, heparin gtt -troponin trending up  2. Essential hypertension: Add when necessary hydralazine, as her received 1 dose of IV labetalol  3. Venous from embolism prophylactic: Therapeutic heparin  4.HL on statins  5. Tobacco abuse counselled smoking  cessation for about 4 mins.      Case discussed with Care Management/Social Worker. Management plans discussed with the patient, family and they are in agreement.  CODE STATUS: full  DVT Prophylaxis: heparin gtt  TOTAL TIME TAKING CARE OF THIS PATIENT: 40 minutes.  >50%  time spent on counselling and coordination of care pt ,wife, Alycia Rossetti (cardiology)  POSSIBLE D/C IN 1-2DAYS, DEPENDING ON CLINICAL CONDITION.   Talayeh Bruinsma M.D on 08/26/2015 at 1:32 PM  Between 7am to 6pm - Pager - [phone]  After 6pm go to [email or web address] - password [email or web address]  [facility]  Office  [phone]  CC: Primary care physician; No PCP Per Patient

## 2015-08-26 NOTE — OR Nursing (Signed)
MD aware of patient agitation and also patient developed hemotoma with ooz, new orders given , cath lab staff in and placing fem-stop to right groin

## 2015-08-26 NOTE — Plan of Care (Signed)
Problem: Consults Goal: MI Patient Education (See Patient Education module for education specifics.) Outcome: Completed/Met Date Met:  08/26/15 Patient started reviewing handouts

## 2015-08-26 NOTE — OR Nursing (Signed)
MD notified of patient status, ie SOB, Tachy, HTN .. Dr Mariah Milling coming to see

## 2015-08-26 NOTE — OR Nursing (Signed)
Dr Mariah Milling into see, new orders noted, Dr Juliann Pares aware of assessment, and TO for IV Lasix given, metoprolol 5 mg given IV per Dr Mariah Milling

## 2015-08-26 NOTE — Progress Notes (Signed)
*  PRELIMINARY RESULTS* Echocardiogram 2D Echocardiogram has been performed.  George Welch Hege 08/26/2015, 2:15 PM

## 2015-08-26 NOTE — Plan of Care (Signed)
Problem: Phase I Progression Outcomes Goal: Voiding-avoid urinary catheter unless indicated Outcome: Progressing Patient has been voiding on urinal.

## 2015-08-26 NOTE — Consult Note (Addendum)
Cardiology Consultation Note  Patient ID: George Welch, MRN: [id number], DOB/AGE: 1976-06-21 39 y.o. Admit date: 08/25/2015   Date of Consult: 08/26/2015 Primary Physician: No PCP Per Patient Primary Cardiologist: New to [facility]  Chief Complaint: Chest pain x 2-3 days Reason for Consult: NSTEMI  HPI: 39 y.o. male with h/o poorly controlled HTN, marijuana abuse, and obesity who has not seen a medical doctor in the outpatient setting since at least 2010 for work related office visit presented to [facility] on 9/8 with 2-3 day history of stuttering chest pain and was found to have a NSTEMI with troponin peak of 6.46 currently.   He denies any previously known cardiac history. He has presented to [facility] ED previously in 2009 with reported chest pain, note or labs are not available in the computer, which apparently was felt to be 2/2 poorly controlled HTN per current notes. He reports history of stuttering chest pain over the past 2-3 days that has worsened in intensity prompting him to come in for evaluation. Pain is substernal and radiates to his left arm. There is associated diaphoresis, palpitations, and nausea. Pain lasts for several minutes at a time and self resolves. Pain is both exertional and non-exertional. Pain is a dull ache. No associated SOB, vomiting, presyncope, or syncope. He notes a long history of ongoing tobacco abuse, smoking 1 ppd for 20 years. He drinks socially with friends and not every day or week. He smokes marijuana and denies any other illegal drug. Weight has been stable. He eats a poor diet of fast food and a considerable amount of fat and salt. He does not have a regular exercise program. He does not check his BP at home and cannot tell me what it typically runs. Of note, he has had a dry cough for the past week or so. No congestion.   Upon his arrival to [facility] he was found to have a troponin of 4.92-->5.50-->6.46. ECG with NSR, 69 bpm, early repolarization, nonspecific st/t changes  inferior leads. CXR with no active disease. CBC unremarkable. SCr 1.37. K+ 3.6. He was started on a heparin gtt. Echo is pending. This morning he is chest pain free.      Past Medical History  Diagnosis Date  . Hypertension     a. poorly controlled  . Obesity   . Drug abuse     a. THC      Most Recent Cardiac Studies: Echo 08/25/2015 pending   Surgical History: History reviewed. No pertinent past surgical history.   Home Meds: Prior to Admission medications   Not on File    Inpatient Medications:  . aspirin EC  81 mg Oral Daily  . aspirin  300 mg Rectal NOW  . atorvastatin  80 mg Oral q1800  . metoprolol tartrate  12.5 mg Oral BID   . heparin 1,300 Units/hr (08/26/15 0106)    Allergies: No Known Allergies  Social History   Social History  . Marital Status: Married    Spouse Name: N/A  . Number of Children: N/A  . Years of Education: N/A   Occupational History  . Not on file.   Social History Main Topics  . Smoking status: Current Every Day Smoker  . Smokeless tobacco: Not on file  . Alcohol Use: Not on file  . Drug Use: Not on file  . Sexual Activity: Not on file   Other Topics Concern  . Not on file   Social History Narrative     Family History  Problem Relation Age of Onset  . CAD Mother     patient and sister deny to me  . CAD Father     patient and sister deny to me  . Hypertension Mother   . Hypertension Father   . Stroke Father      Review of Systems: Review of Systems  Constitutional: Positive for malaise/fatigue and diaphoresis. Negative for fever, chills and weight loss.  HENT: Negative for congestion.   Eyes: Negative for discharge and redness.  Respiratory: Positive for cough. Negative for hemoptysis, sputum production, shortness of breath and wheezing.   Cardiovascular: Positive for chest pain and palpitations. Negative for orthopnea, claudication, leg swelling and PND.  Gastrointestinal: Positive for nausea. Negative for  heartburn, vomiting and abdominal pain.  Musculoskeletal: Negative for myalgias and falls.  Skin: Negative for rash.  Neurological: Positive for tremors and weakness. Negative for dizziness, tingling, sensory change, speech change and focal weakness.  Endo/Heme/Allergies: Does not bruise/bleed easily.  Psychiatric/Behavioral: Positive for substance abuse. The patient is not nervous/anxious.        Marijuana   All other systems reviewed and are negative.   Labs:  Recent Labs  08/25/15 2131 08/26/15 0406 08/26/15 0748  TROPONINI 4.92* 5.50* 6.46*   Lab Results  Component Value Date   WBC 6.9 08/26/2015   HGB 15.5 08/26/2015   HCT 47.0 08/26/2015   MCV 85.6 08/26/2015   PLT 211 08/26/2015     Recent Labs Lab 08/26/15 0406  NA 138  K 3.6  CL 100*  CO2 26  BUN 15  CREATININE 1.15  CALCIUM 9.6  GLUCOSE 110*   No results found for: CHOL, HDL, LDLCALC, TRIG No results found for: DDIMER  Radiology/Studies:  Dg Chest Port 1 View  08/25/2015   CLINICAL DATA:  Chest pain for 2 days.  Shortness of breath.  EXAM: PORTABLE CHEST - 1 VIEW  COMPARISON:  None.  FINDINGS: The heart size and mediastinal contours are within normal limits. Both lungs are clear. The visualized skeletal structures are unremarkable.  IMPRESSION: No active disease.   Electronically Signed   By: Burman Nieves M.D.   On: 08/25/2015 22:48    EKG: NSR, 69 bpm, early repolarization, nonspecific st/t changes inferior leads  Weights: Filed Weights   08/25/15 2128  Weight: 241 lb (109.317 kg)     Physical Exam: Blood pressure 124/86, pulse 84, temperature 98.3 F (36.8 C), temperature source Oral, resp. rate 16, height 5\' 11"  (1.803 m), weight 241 lb (109.317 kg), SpO2 95 %. Body mass index is 33.63 kg/(m^2). General: Well developed, well nourished, in no acute distress. Head: Normocephalic, atraumatic, sclera non-icteric, no xanthomas, nares are without discharge.  Neck: Negative for carotid bruits.  JVD not elevated. Lungs: Clear bilaterally to auscultation without wheezes, rales, or rhonchi. Breathing is unlabored. Heart: RRR with S1 S2. No murmurs, rubs, or gallops appreciated. Abdomen: Soft, non-tender, non-distended with normoactive bowel sounds. No hepatomegaly. No rebound/guarding. No obvious abdominal masses. Msk:  Strength and tone appear normal for age. Extremities: No clubbing or cyanosis. No edema.  Distal pedal pulses are 2+ and equal bilaterally. Neuro: Alert and oriented X 3. No facial asymmetry. No focal deficit. Moves all extremities spontaneously. Psych:  Responds to questions appropriately with a normal affect.    Assessment and Plan:  39 y.o. male with h/o poorly controlled HTN, marijuana abuse, and obesity who has not seen a medical doctor in the outpatient setting since at least 2010 for work related office visit  presented to [facility] on 9/8 with 2-3 day history of stuttering chest pain and was found to have a NSTEMI with troponin peak of 6.46 currently.  1. NSTEMI: -Troponin continues to trend upwards at this time -Schedule for cardiac cath today with Dr. Mariah Milling, MD -Heparin gtt -Echo to evaluate LV function, wall motion, valvular pathology, and right side pressure -Asprin  -Lopressor 12.5 mg bid -Lipitor 80 mg -Check fasting lipid panel and A1C -He has noted a dry cough, if cath is clean consider myocarditis  -He denies illegal drugs outside of marijuana, will check urine drug screen  -Risks and benefits of cardiac catheterization have been discussed with the patient including risks of bleeding, bruising, infection, kidney damage, stroke, heart attack, and death. The patient understands these risks and is willing to proceed with the procedure. All questions have been answered and concerns listened to.   2. Accelerated HTN: -Improved -Continue Lopressor 12.5 mg bid  3. Obesity: -He will need to improve his diet upon discharge -Check fasting lipid panel and  A1C -Needs PCP  4. Drug abuse: -Denies illegal drugs outside of marijuana -Check urine drug screen    Signed, Eula Listen, PA-C Pager: [phone] 08/26/2015, 8:30 AM    Attending Note Patient seen and examined, agree with detailed note above,  Patient presentation and plan discussed on rounds.   Presenting with 2 to 3 days of chest pain, left side, some arm discomfort Pain got worse, sweating, malaise, came to ER Initial troponin was elevated. Currently with mild chest pain  Long smoking hx, also with MAJ, denies drugs EKG with T wave inversion anterolateral leads --Would plan on cardiac cath today for NSTEMI Asa, heparin infusion Risk and benefits discussed with the patient, he is aware of risk of CVA, MI    Signed: Dossie Arbour  M.D., Ph.D.

## 2015-08-27 LAB — BASIC METABOLIC PANEL
Anion gap: 11 (ref 5–15)
BUN: 14 mg/dL (ref 6–20)
CALCIUM: 9.4 mg/dL (ref 8.9–10.3)
CO2: 24 mmol/L (ref 22–32)
CREATININE: 1.11 mg/dL (ref 0.61–1.24)
Chloride: 100 mmol/L — ABNORMAL LOW (ref 101–111)
GFR calc Af Amer: >60 mL/min (ref >60)
GLUCOSE: 106 mg/dL — AB (ref 65–99)
POTASSIUM: 3.8 mmol/L (ref 3.5–5.1)
SODIUM: 135 mmol/L (ref 135–145)

## 2015-08-27 LAB — CBC
HCT: 47.4 % (ref 40.0–52.0)
Hemoglobin: 15.4 g/dL (ref 13.0–18.0)
MCH: 27.8 pg (ref 26.0–34.0)
MCHC: 32.6 g/dL (ref 32.0–36.0)
MCV: 85.4 fL (ref 80.0–100.0)
PLATELETS: 221 10*3/uL (ref 150–440)
RBC: 5.54 MIL/uL (ref 4.40–5.90)
RDW: 14.7 % — ABNORMAL HIGH (ref 11.5–14.5)
WBC: 8.8 10*3/uL (ref 3.8–10.6)

## 2015-08-27 LAB — HEMOGLOBIN: HEMOGLOBIN: 14 g/dL (ref 13.0–18.0)

## 2015-08-27 LAB — GLUCOSE, CAPILLARY: GLUCOSE-CAPILLARY: 95 mg/dL (ref 65–99)

## 2015-08-27 MED ORDER — LISINOPRIL 10 MG PO TABS
10.0000 mg | ORAL_TABLET | Freq: Every day | ORAL | Status: DC
  Administered 2015-08-27 – 2015-08-28 (×2): 10 mg via ORAL
  Filled 2015-08-27 (×2): qty 1

## 2015-08-27 NOTE — Progress Notes (Signed)
[facility] - [place] at [facility]   PATIENT NAME: George Welch    MR#:  [id number]  DATE OF BIRTH:  [birth year]  SUBJECTIVE:   -Admitted for non-ST segment elevation MI, status post cardiac catheter and 4 stents put in yesterday. -Continues to have groin oozing at this time. No chest pain though. -Complains of back pain which is chronic.  REVIEW OF SYSTEMS:   Review of Systems  Constitutional: Negative for fever, chills and weight loss.  HENT: Negative for ear discharge, ear pain and nosebleeds.   Eyes: Negative for blurred vision, pain and discharge.  Respiratory: Negative for sputum production, shortness of breath, wheezing and stridor.   Cardiovascular: Negative for chest pain, palpitations, orthopnea and PND.  Gastrointestinal: Negative for nausea, vomiting, abdominal pain and diarrhea.  Genitourinary: Negative for urgency and frequency.  Musculoskeletal: Positive for back pain. Negative for joint pain.       Has chronic back pain  Neurological: Negative for sensory change, speech change, focal weakness and weakness.  Endo/Heme/Allergies:       Bleeding from right groin catheterization site  Psychiatric/Behavioral: Negative for depression and hallucinations. The patient is not nervous/anxious.   All other systems reviewed and are negative.   DRUG ALLERGIES:  No Known Allergies  VITALS:  Blood pressure 161/100, pulse 115, temperature 98.6 F (37 C), temperature source Oral, resp. rate 16, height  (1.803 m), weight 103.2 kg (227 lb 8.2 oz), SpO2 100 %.  PHYSICAL EXAMINATION:   Physical Exam  GENERAL:  39 y.o.-year-old patient lying in the bed with no acute distress.  EYES: Pupils equal, round, reactive to light and accommodation. No scleral icterus. Extraocular muscles intact.  HEENT: Head atraumatic, normocephalic. Oropharynx and nasopharynx clear.  NECK:  Supple, no jugular venous distention. No thyroid enlargement, no tenderness.   LUNGS: Normal breath sounds bilaterally, no wheezing, rales, rhonchi. No use of accessory muscles of respiration.  CARDIOVASCULAR: S1, S2 normal. No murmurs, rubs, or gallops.  ABDOMEN: Soft, nontender, nondistended. Bowel sounds present. No organomegaly or mass.  EXTREMITIES: No cyanosis, clubbing or edema b/l.    Slows noted from right groin catheterization site once Femoral stop was removed. NEUROLOGIC: Cranial nerves II through XII are intact. No focal Motor or sensory deficits b/l.   PSYCHIATRIC: The patient is alert and oriented x 3.  SKIN: No obvious rash, lesion, or ulcer.    LABORATORY PANEL:   CBC  Recent Labs Lab 08/27/15 0502  WBC 8.8  HGB 15.4  HCT 47.4  PLT 221    Chemistries   Recent Labs Lab 08/27/15 0502  NA 135  K 3.8  CL 100*  CO2 24  GLUCOSE 106*  BUN 14  CREATININE 1.11  CALCIUM 9.4    Cardiac Enzymes  Recent Labs Lab 08/26/15 1522  TROPONINI 14.43*    RADIOLOGY:  Dg Chest Port 1 View  08/25/2015   CLINICAL DATA:  Chest pain for 2 days.  Shortness of breath.  EXAM: PORTABLE CHEST - 1 VIEW  COMPARISON:  None.  FINDINGS: The heart size and mediastinal contours are within normal limits. Both lungs are clear. The visualized skeletal structures are unremarkable.  IMPRESSION: No active disease.   Electronically Signed   By: Burman Nieves M.D.   On: 08/25/2015 22:48     ASSESSMENT AND PLAN:   39 year old African-American gentleman history of essential hypertension not taking any medications at home, presenting with chest pain.  1. Acute NSTEMI: -Appreciate cardiac consult. -Status post cardiac  catheterization showing extensive blockages and RCA and also obtuse marginal off the left circumflex. 4 stents put in total. -Started on Brillinta and aspirin -Continue metoprolol and statin -Off heparin drip. Nitroglycerin as needed.  2. Right femoral bleeding-at the catheter site.  -Continue to apply pressure for 20 minutes, then replace the  femoral stop. Wheezing has slowed down. -Hemoglobin is stable for now, but sinus tachycardia noted. -Repeat hemoglobin later today and recheck in morning.  3. Essential hypertension: On metoprolol, lisinopril also added today. -IV hydralazine when necessary  4.hyperlipidemia-started on statin -LDL cholesterol of 159  5. Tobacco abuse counselled smoking cessation for about 4 mins. -Discussed smoking cessation again today. On NicoDerm in the hospital.  6. DVT prophylaxis-just Ted's and SCDs for now. Due to his groin oozing. Patient on Brilinta, and aspirin anyways for his stent.  Continue to monitor in ICU due to his ongoing femoral bleeding. If stable later today or tomorrow will transfer to telemetry.  Case discussed with Care Management/Social Worker. Management plans discussed with the patient, family and they are in agreement.  CODE STATUS: full  TOTAL TIME TAKING CARE OF THIS PATIENT: 39 minutes.   POSSIBLE D/C IN 1-2DAYS, DEPENDING ON CLINICAL CONDITION.   Enid Baas M.D on 08/27/2015 at 10:37 AM  Between 7am to 6pm - Pager - [phone]  After 6pm go to [email or web address] - password [email or web address] [facility]  [facility]  Office  [phone]  CC: Primary care physician; No PCP Per Patient

## 2015-08-27 NOTE — Progress Notes (Addendum)
Patient ID: George Welch, male   DOB: October 03, 1976, 39 y.o.   MRN: [id number]   SUBJECTIVE: Patient continues to have some oozing at right groin cath site.  No evident hematoma.  Some back pain but this is chronic for him.  No chest pain. BP running high. Hemoglobin stable.   9/9 had intervention to OM3 with DES and RCA with DES by Dr Juliann Pares (full note not available yet).   Scheduled Meds: . aspirin  81 mg Oral Daily  . atorvastatin  80 mg Oral q1800  . lisinopril  10 mg Oral Daily  . metoprolol tartrate  50 mg Oral BID  . nicotine  21 mg Transdermal Daily  . ticagrelor  90 mg Oral BID   Continuous Infusions:  PRN Meds:.acetaminophen, acetaminophen, hydrALAZINE, LORazepam, morphine injection, nitroGLYCERIN, ondansetron (ZOFRAN) IV    Filed Vitals:   08/27/15 0354 08/27/15 0400 08/27/15 0500 08/27/15 0600  BP: 157/117 148/93 145/84 143/97  Pulse:  100 109 115  Temp:  98.6 F (37 C)    TempSrc:  Oral    Resp:  16 14 16   Height:      Weight:      SpO2:  97% 96% 100%    Intake/Output Summary (Last 24 hours) at 08/27/15 0926 Last data filed at 08/27/15 0427  Gross per 24 hour  Intake    820 ml  Output   3000 ml  Net  -2180 ml    LABS: Basic Metabolic Panel:  Recent Labs  34/74/25 2131 08/26/15 0406  NA 136 138  K 3.6 3.6  CL 98* 100*  CO2 27 26  GLUCOSE 148* 110*  BUN 17 15  CREATININE 1.37* 1.15  CALCIUM 9.9 9.6   Liver Function Tests: No results for input(s): AST, ALT, ALKPHOS, BILITOT, PROT, ALBUMIN in the last 72 hours. No results for input(s): LIPASE, AMYLASE in the last 72 hours. CBC:  Recent Labs  08/26/15 0746 08/27/15 0502  WBC 6.9 8.8  HGB 15.5 15.4  HCT 47.0 47.4  MCV 85.6 85.4  PLT 211 221   Cardiac Enzymes:  Recent Labs  08/26/15 0406 08/26/15 0748 08/26/15 1522  TROPONINI 5.50* 6.46* 14.43*   BNP: Invalid input(s): POCBNP D-Dimer: No results for input(s): DDIMER in the last 72 hours. Hemoglobin A1C:  Recent Labs   08/26/15 0746  HGBA1C 5.7   Fasting Lipid Panel:  Recent Labs  08/26/15 0746  CHOL 250*  HDL 34*  LDLCALC 159*  TRIG 283*  CHOLHDL 7.4   Thyroid Function Tests: No results for input(s): TSH, T4TOTAL, T3FREE, THYROIDAB in the last 72 hours.  Invalid input(s): FREET3 Anemia Panel: No results for input(s): VITAMINB12, FOLATE, FERRITIN, TIBC, IRON, RETICCTPCT in the last 72 hours.  RADIOLOGY: Dg Chest Port 1 View  08/25/2015   CLINICAL DATA:  Chest pain for 2 days.  Shortness of breath.  EXAM: PORTABLE CHEST - 1 VIEW  COMPARISON:  None.  FINDINGS: The heart size and mediastinal contours are within normal limits. Both lungs are clear. The visualized skeletal structures are unremarkable.  IMPRESSION: No active disease.   Electronically Signed   By: Burman Nieves M.D.   On: 08/25/2015 22:48    PHYSICAL EXAM General: NAD Neck: No JVD, no thyromegaly or thyroid nodule.  Lungs: Clear to auscultation bilaterally with normal respiratory effort. CV: Nondisplaced PMI.  Heart regular S1/S2, no S3/S4, no murmur.  No peripheral edema.  No carotid bruit.  Normal pedal pulses.  Abdomen: Soft, nontender, no hepatosplenomegaly, no  distention.  Neurologic: Alert and oriented x 3.  Psych: Normal affect. Extremities: No clubbing or cyanosis. Right groin cath site with ongoing oozing, no evident hematoma.   TELEMETRY: Reviewed telemetry pt in NSR  ASSESSMENT AND PLAN: 39 yo with history of smoking, hyperlipidemia, HTN presented with NSTEMI.  1. CAD: NSTEMI.  Severe disease in multiple OMs and RCA on angiography yesterday.  EF 55-60% on LV-gram.  PCI with DES to OM3 and RCA yesterday by Dr Juliann Pares.  No chest pain today but having ongoing oozing at right groin site.  No evident hematoma on exam.  Has some back pain but this has been chronic for him.  His hemoglobin is stable this morning.  - Continue ASA 81 and ticagrelor.  - Continue beta blocker and atorvastatin.  - Will have nurse hold  pressure 20-30 minutes now, place back fem-stop if ongoing oozing.  2. HTN: BP running high.  Will add lisinopril 10 mg daily.  Needs BMET today.  3. Smoking: Encouraged him to quit.  4. Disposition: Will keep him today with ongoing groin oozing. Will need med assistance, will consult care management.   Marca Ancona 08/27/2015 9:35 AM

## 2015-08-27 NOTE — Progress Notes (Signed)
Femstop removed.  MD [facility] aware.  Requesting to keep patient on bedrest for a few more hours.

## 2015-08-28 LAB — CBC
HCT: 41.7 % (ref 40.0–52.0)
Hemoglobin: 13.6 g/dL (ref 13.0–18.0)
MCH: 28.1 pg (ref 26.0–34.0)
MCHC: 32.6 g/dL (ref 32.0–36.0)
MCV: 86 fL (ref 80.0–100.0)
PLATELETS: 198 10*3/uL (ref 150–440)
RBC: 4.84 MIL/uL (ref 4.40–5.90)
RDW: 14.5 % (ref 11.5–14.5)
WBC: 7.6 10*3/uL (ref 3.8–10.6)

## 2015-08-28 LAB — BASIC METABOLIC PANEL
Anion gap: 8 (ref 5–15)
BUN: 18 mg/dL (ref 6–20)
CALCIUM: 9.1 mg/dL (ref 8.9–10.3)
CO2: 27 mmol/L (ref 22–32)
CREATININE: 1.11 mg/dL (ref 0.61–1.24)
Chloride: 102 mmol/L (ref 101–111)
GFR calc Af Amer: >60 mL/min (ref >60)
GFR calc non Af Amer: >60 mL/min (ref >60)
GLUCOSE: 100 mg/dL — AB (ref 65–99)
Potassium: 4.1 mmol/L (ref 3.5–5.1)
Sodium: 137 mmol/L (ref 135–145)

## 2015-08-28 MED ORDER — NITROGLYCERIN 0.4 MG SL SUBL: 0.4000 mg | SUBLINGUAL_TABLET | SUBLINGUAL | Status: DC | PRN

## 2015-08-28 MED ORDER — METOPROLOL TARTRATE 25 MG PO TABS
25.0000 mg | ORAL_TABLET | Freq: Once | ORAL | Status: AC
  Administered 2015-08-28: 25 mg via ORAL
  Filled 2015-08-28: qty 1

## 2015-08-28 MED ORDER — ATORVASTATIN CALCIUM 80 MG PO TABS: 80.0000 mg | ORAL_TABLET | Freq: Every day | ORAL | Status: DC

## 2015-08-28 MED ORDER — LISINOPRIL 10 MG PO TABS: 10.0000 mg | ORAL_TABLET | Freq: Every day | ORAL | Status: DC

## 2015-08-28 MED ORDER — TICAGRELOR 90 MG PO TABS: 90.0000 mg | ORAL_TABLET | Freq: Two times a day (BID) | ORAL | Status: DC

## 2015-08-28 MED ORDER — METOPROLOL TARTRATE 50 MG PO TABS: 75.0000 mg | ORAL_TABLET | Freq: Two times a day (BID) | ORAL | Status: DC

## 2015-08-28 MED ORDER — METOPROLOL TARTRATE 75 MG PO TABS: 75.0000 mg | ORAL_TABLET | Freq: Two times a day (BID) | ORAL | Status: DC

## 2015-08-28 MED ORDER — ASPIRIN 81 MG PO CHEW: 81.0000 mg | CHEWABLE_TABLET | Freq: Every day | ORAL | Status: DC

## 2015-08-28 NOTE — Care Management Note (Signed)
Case Management Note  Patient Details  Name: VICTOR LANGENBACH MRN: [id number] Date of Birth: [birth year]  Subjective/Objective:     Spoke with Mr Bleicher and his wife about the importance of getting established with a PCP to manage Mr Maziarz's chronic illness. Provided Mr Zapata with a generic drug coupon, a Chief Operating Officer, a list of local clinics which accept uninsured clients, applications to both the [facility] and [facility] across the street from [facility]. Ms Ahrendt verbalized understanding of presenting the Brilinta coupon at a pharmacy with the [facility] prescription.                Action/Plan:   Expected Discharge Date:  08/28/15               Expected Discharge Plan:     In-House Referral:     Discharge planning Services     Post Acute Care Choice:    Choice offered to:     DME Arranged:    DME Agency:     HH Arranged:    HH Agency:     Status of Service:     Medicare Important Message Given:    Date Medicare IM Given:    Medicare IM give by:    Date Additional Medicare IM Given:    Additional Medicare Important Message give by:     If discussed at Long Length of Stay Meetings, dates discussed:    Additional Comments:  Carver Murakami A, RN 08/28/2015, 11:48 AM

## 2015-08-28 NOTE — Progress Notes (Signed)
Pt discharged home per md orders instructions given to pt and his wife they both stated they understood the instructions, vss, no c/o pain, no s/s of bleeding; prescriptions given to pt

## 2015-08-28 NOTE — Discharge Summary (Signed)
[facility] [place] at [facility]   PATIENT NAME: George Welch    MR#:  [id number]  DATE OF BIRTH:  Jan 14, 1976  DATE OF ADMISSION:  08/25/2015 ADMITTING PHYSICIAN: Wyatt Haste, MD  DATE OF DISCHARGE: 08/28/2015  PRIMARY CARE PHYSICIAN: No PCP Per Patient    ADMISSION DIAGNOSIS:  Acute non-ST-elevation myocardial infarction [I21.4] Essential hypertension [I10] Ischemic chest pain [I20.9]  DISCHARGE DIAGNOSIS:  Active Problems:   NSTEMI (non-ST elevated myocardial infarction)   Essential hypertension   Ischemic chest pain   Angina pectoris   Smoker   Acute non-ST-elevation myocardial infarction   SECONDARY DIAGNOSIS:   Past Medical History  Diagnosis Date  . Hypertension     a. poorly controlled  . Obesity   . Drug abuse     a. [facility]    HOSPITAL COURSE:   39 year old African-American gentleman history of essential hypertension not taking any medications at home, presenting with chest pain.  1. Acute NSTEMI: -Appreciate cardiac consult. -Status post cardiac catheterization showing extensive blockages and RCA and also obtuse marginal off the left circumflex. 4 stents put in total. -on Brillinta and aspirin -Continue metoprolol and statin -Nitroglycerin as needed. -Echo with LVH and diastolic dysfunction, normal ejection fraction. No wall motion abnormalities noted  2. Right femoral bleeding-at the catheter site.  -Improved with femoral stop and also pressure application yesterday. -No further bleeding, hemoglobin is stable. Patient ambulating without any difficulty at this time..  3. Essential hypertension: On metoprolol, lisinopril. -Metoprolol dose increased as heart rate elevated on ambulation.  4.hyperlipidemia-started on high dose statin due to his acute MI. -LDL cholesterol of 159  5. Tobacco abuse: -Discussed smoking cessation again today. On NicoDerm in the hospital.  Patient very eager and anxious to go home. We will  discharge today and recommend outpatient follow-up with cardiology. Strongly counseled against smoking and also recommended to be compliant with his medications.  DISCHARGE CONDITIONS:   Stable  CONSULTS OBTAINED:  Treatment Team:  Antonieta Iba, MD  DRUG ALLERGIES:  No Known Allergies  DISCHARGE MEDICATIONS:   Current Discharge Medication List    START taking these medications   Details  aspirin 81 MG chewable tablet Chew 1 tablet (81 mg total) by mouth daily. Qty: 30 tablet, Refills: 3    atorvastatin (LIPITOR) 80 MG tablet Take 1 tablet (80 mg total) by mouth daily at 6 PM. Qty: 30 tablet, Refills: 3    lisinopril (PRINIVIL,ZESTRIL) 10 MG tablet Take 1 tablet (10 mg total) by mouth daily. Qty: 30 tablet, Refills: 3    Metoprolol Tartrate 75 MG TABS Take 75 mg by mouth 2 (two) times daily. Qty: 30 tablet, Refills: 3    nitroGLYCERIN (NITROSTAT) 0.4 MG SL tablet Place 1 tablet (0.4 mg total) under the tongue every 5 (five) minutes as needed for chest pain. Qty: 30 tablet, Refills: 3    ticagrelor (BRILINTA) 90 MG TABS tablet Take 1 tablet (90 mg total) by mouth 2 (two) times daily. Qty: 60 tablet, Refills: 3         DISCHARGE INSTRUCTIONS:   1. Cardiology f/u in 2 weeks 2. COMPLIANCE WITH MEDICATIONS 3. Smoking cessation 4. Cardiac rehab  If you experience worsening of your admission symptoms, develop shortness of breath, life threatening emergency, suicidal or homicidal thoughts you must seek medical attention immediately by calling 911 or calling your MD immediately  if symptoms less severe.  You Must read complete instructions/literature along with all the possible adverse reactions/side effects  for all the Medicines you take and that have been prescribed to you. Take any new Medicines after you have completely understood and accept all the possible adverse reactions/side effects.   Please note  You were cared for by a hospitalist during your hospital  stay. If you have any questions about your discharge medications or the care you received while you were in the hospital after you are discharged, you can call the unit and asked to speak with the hospitalist on call if the hospitalist that took care of you is not available. Once you are discharged, your primary care physician will handle any further medical issues. Please note that NO REFILLS for any discharge medications will be authorized once you are discharged, as it is imperative that you return to your primary care physician (or establish a relationship with a primary care physician if you do not have one) for your aftercare needs so that they can reassess your need for medications and monitor your lab values.    Today   CHIEF COMPLAINT:   Chief Complaint  Patient presents with  . Chest Pain     VITAL SIGNS:  Blood pressure 127/96, pulse 102, temperature 98.4 F (36.9 C), temperature source Oral, resp. rate 14, height 5\' 11"  (1.803 m), weight 103.2 kg (227 lb 8.2 oz), SpO2 96 %.  I/O:   Intake/Output Summary (Last 24 hours) at 08/28/15 1024 Last data filed at 08/28/15 0713  Gross per 24 hour  Intake      0 ml  Output    300 ml  Net   -300 ml    PHYSICAL EXAMINATION:   Physical Exam  GENERAL: 39 y.o.-year-old patient lying in the bed with no acute distress.  EYES: Pupils equal, round, reactive to light and accommodation. No scleral icterus. Extraocular muscles intact.  HEENT: Head atraumatic, normocephalic. Oropharynx and nasopharynx clear.  NECK: Supple, no jugular venous distention. No thyroid enlargement, no tenderness.  LUNGS: Normal breath sounds bilaterally, no wheezing, rales, rhonchi. No use of accessory muscles of respiration.  CARDIOVASCULAR: S1, S2 normal. No murmurs, rubs, or gallops.  ABDOMEN: Soft, nontender, nondistended. Bowel sounds present. No organomegaly or mass.  EXTREMITIES: No cyanosis, clubbing or edema b/l.  Groin site looks clean  today, without any evidence of bleeding. NEUROLOGIC: Cranial nerves II through XII are intact. No focal Motor or sensory deficits b/l.  PSYCHIATRIC: The patient is alert and oriented x 3.  SKIN: No obvious rash, lesion, or ulcer.    DATA REVIEW:   CBC  Recent Labs Lab 08/28/15 0400  WBC 7.6  HGB 13.6  HCT 41.7  PLT 198    Chemistries   Recent Labs Lab 08/28/15 0400  NA 137  K 4.1  CL 102  CO2 27  GLUCOSE 100*  BUN 18  CREATININE 1.11  CALCIUM 9.1    Cardiac Enzymes  Recent Labs Lab 08/26/15 1522  TROPONINI 14.43*    Microbiology Results  Results for orders placed or performed during the hospital encounter of 08/25/15  MRSA PCR Screening     Status: None   Collection Time: 08/26/15  3:10 PM  Result Value Ref Range Status   MRSA by PCR NEGATIVE NEGATIVE Final    Comment:        The GeneXpert MRSA Assay (FDA approved for NASAL specimens only), is one component of a comprehensive MRSA colonization surveillance program. It is not intended to diagnose MRSA infection nor to guide or monitor treatment for MRSA infections.  RADIOLOGY:  No results found.  EKG:   Orders placed or performed during the hospital encounter of 08/25/15  . ED EKG  . ED EKG  . EKG 12-Lead immediately post procedure  . EKG 12-Lead  . EKG 12-Lead immediately post procedure  . EKG 12-Lead      Management plans discussed with the patient, family and they are in agreement.  CODE STATUS:     Code Status Orders        Start     Ordered   08/26/15 1134  Full code   Continuous     08/26/15 1135      TOTAL TIME TAKING CARE OF THIS PATIENT: 36 minutes.    Enid Baas M.D on 08/28/2015 at 10:24 AM  Between 7am to 6pm - Pager - [phone]  After 6pm go to [email or web address] - password [email or web address] [facility]  [facility]  Office  [phone]  CC: Primary care physician; No PCP Per Patient

## 2015-08-28 NOTE — Progress Notes (Signed)
Pt ambulated heart rate increased 120's Sinus Tach Dr. Nemiah Commander aware stated she would adjust metoprolol dose; no s/s of bleeding at femoral site; other vss

## 2015-08-28 NOTE — Progress Notes (Signed)
Patient ID: George Welch, male   DOB: 1976/08/28, 39 y.o.   MRN: [id number]   SUBJECTIVE: No chest pain.  Groin oozing resolved yesterday and site looks stable today.  No hematoma. Hemoglobin stable.   9/9 had intervention to OM3 with DES and RCA with DES by Dr Juliann Pares (full note not available yet).   Scheduled Meds: . aspirin  81 mg Oral Daily  . atorvastatin  80 mg Oral q1800  . lisinopril  10 mg Oral Daily  . metoprolol tartrate  25 mg Oral Once  . metoprolol tartrate  75 mg Oral BID  . nicotine  21 mg Transdermal Daily  . ticagrelor  90 mg Oral BID   Continuous Infusions:  PRN Meds:.acetaminophen, acetaminophen, hydrALAZINE, LORazepam, morphine injection, nitroGLYCERIN, ondansetron (ZOFRAN) IV    Filed Vitals:   08/28/15 0600 08/28/15 0700 08/28/15 0900 08/28/15 0904  BP: 144/82 133/85 127/96 127/96  Pulse: 92 96 113 102  Temp:  98.4 F (36.9 C)    TempSrc:  Oral    Resp: Height:      Weight:      SpO2:  98% 96%     Intake/Output Summary (Last 24 hours) at 08/28/15 1019 Last data filed at 08/28/15 0713  Gross per 24 hour  Intake      0 ml  Output    300 ml  Net   -300 ml    LABS: Basic Metabolic Panel:  Recent Labs  40/98/11 0502 08/28/15 0400  NA 135 137  K 3.8 4.1  CL 100* 102  CO2 24 27  GLUCOSE 106* 100*  BUN 14 18  CREATININE 1.11 1.11  CALCIUM 9.4 9.1   Liver Function Tests: No results for input(s): AST, ALT, ALKPHOS, BILITOT, PROT, ALBUMIN in the last 72 hours. No results for input(s): LIPASE, AMYLASE in the last 72 hours. CBC:  Recent Labs  08/27/15 0502 08/27/15 1728 08/28/15 0400  WBC 8.8  --  7.6  HGB 15.4 14.0 13.6  HCT 47.4  --  41.7  MCV 85.4  --  86.0  PLT 221  --  198   Cardiac Enzymes:  Recent Labs  08/26/15 0406 08/26/15 0748 08/26/15 1522  TROPONINI 5.50* 6.46* 14.43*   BNP: Invalid input(s): POCBNP D-Dimer: No results for input(s): DDIMER in the last 72 hours. Hemoglobin A1C:  Recent Labs  08/26/15 0746  HGBA1C 5.7   Fasting Lipid Panel:  Recent Labs  08/26/15 0746  CHOL 250*  HDL 34*  LDLCALC 159*  TRIG 283*  CHOLHDL 7.4   Thyroid Function Tests: No results for input(s): TSH, T4TOTAL, T3FREE, THYROIDAB in the last 72 hours.  Invalid input(s): FREET3 Anemia Panel: No results for input(s): VITAMINB12, FOLATE, FERRITIN, TIBC, IRON, RETICCTPCT in the last 72 hours.  RADIOLOGY: Dg Chest Port 1 View  08/25/2015   CLINICAL DATA:  Chest pain for 2 days.  Shortness of breath.  EXAM: PORTABLE CHEST - 1 VIEW  COMPARISON:  None.  FINDINGS: The heart size and mediastinal contours are within normal limits. Both lungs are clear. The visualized skeletal structures are unremarkable.  IMPRESSION: No active disease.   Electronically Signed   By: Burman Nieves M.D.   On: 08/25/2015 22:48    PHYSICAL EXAM General: NAD Neck: No JVD, no thyromegaly or thyroid nodule.  Lungs: Clear to auscultation bilaterally with normal respiratory effort. CV: Nondisplaced PMI.  Heart regular S1/S2, no S3/S4, no murmur.  No peripheral edema.  No carotid bruit.  Normal pedal pulses.  Abdomen: Soft, nontender, no hepatosplenomegaly, no distention.  Neurologic: Alert and oriented x 3.  Psych: Normal affect. Extremities: No clubbing or cyanosis. Right groin cath site benign.   TELEMETRY: Reviewed telemetry pt in NSR in 90s  ASSESSMENT AND PLAN: 39 yo with history of smoking, hyperlipidemia, HTN presented with NSTEMI.  1. CAD: NSTEMI.  Severe disease in multiple OMs and RCA on angiography yesterday.  EF 55-60% on LV-gram, EF 55-60% by echo (difficult study).  PCI with DES to OM3 and RCA Friday by Dr Juliann Pares.  No chest pain.  Right groin cath site benign without hematoma, hemoglobin good. - Continue ASA 81 and ticagrelor.  Will need care management help to make sure he can get Brilinta.  - Continue atorvastatin.  - HR on the high side, can increase metoprolol to 75 mg bid.   2. HTN: BP better on  lisinopril.  3. Smoking: Encouraged him to quit.  4. Disposition: Think he can go home today.  Will need followup with Gollan in about 2 wks, sent message to office to call him tomorrow.  Would remain out of work until he has followup with Gollan, may need note to that effect.  Needs to quit smoking.  Home meds: ticagrelor 90 bid, ASA 81, atorvastatin 80 daily, lisinopril 10 daily, metoprolol 75 mg bid.   Marca Ancona 08/28/2015 10:19 AM

## 2015-08-29 ENCOUNTER — Telehealth: Payer: Self-pay

## 2015-08-29 MED ORDER — OXYCODONE-ACETAMINOPHEN 5-325 MG PO TABS: 1.0000 | ORAL_TABLET | Freq: Four times a day (QID) | ORAL | Status: DC | PRN

## 2015-08-29 NOTE — Telephone Encounter (Signed)
-----   Message from Coralee Rud sent at 08/29/2015  9:37 AM EDT ----- Regarding: tcm/ph 09/08/2015 2:30 dr Kirke Corin

## 2015-08-29 NOTE — Telephone Encounter (Signed)
Patient contacted regarding discharge from [facility] on 08/29/15.  Patient understands to follow up with Dr. Kirke Corin on 09/08/15 at 2:30 at [facility]. Patient understands discharge instructions? yes Patient understands medications and regiment? yes Patient understands to bring all medications to this visit? yes  Pt states that he is ready to go back to work.  Will discuss at f/u.

## 2015-08-30 ENCOUNTER — Encounter: Payer: Self-pay | Admitting: Cardiovascular Disease

## 2015-09-03 ENCOUNTER — Encounter: Payer: Self-pay | Admitting: Adult Health

## 2015-09-03 ENCOUNTER — Emergency Department
Admission: EM | Admit: 2015-09-03 | Discharge: 2015-09-03 | Disposition: A | Discharge status: Home / Self Care | Payer: No Typology Code available for payment source | Attending: Emergency Medicine | Admitting: Emergency Medicine

## 2015-09-03 DIAGNOSIS — I10 Essential (primary) hypertension: Secondary | ICD-10-CM | POA: Insufficient documentation

## 2015-09-03 DIAGNOSIS — Z7902 Long term (current) use of antithrombotics/antiplatelets: Secondary | ICD-10-CM | POA: Insufficient documentation

## 2015-09-03 DIAGNOSIS — T783XXA Angioneurotic edema, initial encounter: Secondary | ICD-10-CM | POA: Diagnosis present

## 2015-09-03 DIAGNOSIS — T464X5A Adverse effect of angiotensin-converting-enzyme inhibitors, initial encounter: Secondary | ICD-10-CM | POA: Diagnosis not present

## 2015-09-03 DIAGNOSIS — Z87891 Personal history of nicotine dependence: Secondary | ICD-10-CM | POA: Insufficient documentation

## 2015-09-03 DIAGNOSIS — Y9389 Activity, other specified: Secondary | ICD-10-CM | POA: Insufficient documentation

## 2015-09-03 DIAGNOSIS — Z7982 Long term (current) use of aspirin: Secondary | ICD-10-CM | POA: Insufficient documentation

## 2015-09-03 DIAGNOSIS — Y998 Other external cause status: Secondary | ICD-10-CM | POA: Insufficient documentation

## 2015-09-03 DIAGNOSIS — Y9289 Other specified places as the place of occurrence of the external cause: Secondary | ICD-10-CM | POA: Diagnosis not present

## 2015-09-03 DIAGNOSIS — X58XXXA Exposure to other specified factors, initial encounter: Secondary | ICD-10-CM | POA: Insufficient documentation

## 2015-09-03 DIAGNOSIS — Z79899 Other long term (current) drug therapy: Secondary | ICD-10-CM | POA: Insufficient documentation

## 2015-09-03 HISTORY — DX: Presence of coronary angioplasty implant and graft: Z95.5

## 2015-09-03 MED ORDER — FAMOTIDINE IN NACL 20-0.9 MG/50ML-% IV SOLN
20.0000 mg | Freq: Once | INTRAVENOUS | Status: AC
  Administered 2015-09-03: 20 mg via INTRAVENOUS

## 2015-09-03 MED ORDER — DIPHENHYDRAMINE HCL 50 MG/ML IJ SOLN
INTRAMUSCULAR | Status: AC
  Filled 2015-09-03: qty 1

## 2015-09-03 MED ORDER — FAMOTIDINE IN NACL 20-0.9 MG/50ML-% IV SOLN
INTRAVENOUS | Status: AC
  Filled 2015-09-03: qty 50

## 2015-09-03 MED ORDER — METHYLPREDNISOLONE SODIUM SUCC 125 MG IJ SOLR
INTRAMUSCULAR | Status: AC
  Filled 2015-09-03: qty 2

## 2015-09-03 MED ORDER — SODIUM CHLORIDE 0.9 % IV BOLUS (SEPSIS)
1000.0000 mL | Freq: Once | INTRAVENOUS | Status: AC
  Administered 2015-09-03: 1000 mL via INTRAVENOUS

## 2015-09-03 MED ORDER — PREDNISONE 20 MG PO TABS: 40.0000 mg | ORAL_TABLET | Freq: Every day | ORAL | Status: DC

## 2015-09-03 MED ORDER — METHYLPREDNISOLONE SODIUM SUCC 125 MG IJ SOLR
125.0000 mg | Freq: Once | INTRAMUSCULAR | Status: AC
  Administered 2015-09-03: 125 mg via INTRAVENOUS

## 2015-09-03 MED ORDER — DIPHENHYDRAMINE HCL 50 MG/ML IJ SOLN
50.0000 mg | Freq: Once | INTRAMUSCULAR | Status: AC
  Administered 2015-09-03: 50 mg via INTRAVENOUS

## 2015-09-03 NOTE — ED Provider Notes (Signed)
[facility] Emergency Department Provider Note  Time seen: 5:47 PM  I have reviewed the triage vital signs and the nursing notes.   HISTORY  Chief Complaint Angioedema    HPI George Welch is a 39 y.o. male with a past medical history of hypertension, recent myocardial infarction with 4 stents placed, presents the emergency department with lower lip swelling. According to the patient and record review he was admitted to the hospital for a likely myocardial infarction, patient had 4 stents placed and he was discharged on 911/16. Patient was started on several new medications including lisinopril. Patient states around 3 hours ago he developed lower lip swelling which has continued to progressively came to the emergency department for evaluation. Denies any trouble breathing, swallowing, denies any shortness of breath or rash/itching.     Past Medical History  Diagnosis Date  . Hypertension     a. poorly controlled  . Obesity   . Drug abuse     a. [facility]C  . Stented coronary artery     Patient Active Problem List   Diagnosis Date Noted  . Essential hypertension   . Ischemic chest pain   . Angina pectoris   . Smoker   . Acute non-ST-elevation myocardial infarction   . NSTEMI (non-ST elevated myocardial infarction) 08/25/2015    Past Surgical History  Procedure Laterality Date  . Cardiac catheterization N/A 08/26/2015    Procedure: Left Heart Cath and Coronary Angiography;  Surgeon: Antonieta Iba, MD;  Location: [facility] INVASIVE CV LAB;  Service: Cardiovascular;  Laterality: N/A;  . Cardiac catheterization N/A 08/26/2015    Procedure: Coronary Stent Intervention;  Surgeon: Alwyn Pea, MD;  Location: [facility] INVASIVE CV LAB;  Service: Cardiovascular;  Laterality: N/A;    Current Outpatient Rx  Name  Route  Sig  Dispense  Refill  . aspirin 81 MG chewable tablet   Oral   Chew 1 tablet (81 mg total) by mouth daily.   30 tablet   3   . atorvastatin  (LIPITOR) 80 MG tablet   Oral   Take 1 tablet (80 mg total) by mouth daily at 6 PM.   30 tablet   3   . lisinopril (PRINIVIL,ZESTRIL) 10 MG tablet   Oral   Take 1 tablet (10 mg total) by mouth daily.   30 tablet   3   . Metoprolol Tartrate 75 MG TABS   Oral   Take 75 mg by mouth 2 (two) times daily.   30 tablet   3   . nitroGLYCERIN (NITROSTAT) 0.4 MG SL tablet   Sublingual   Place 1 tablet (0.4 mg total) under the tongue every 5 (five) minutes as needed for chest pain.   30 tablet   3   . oxyCODONE-acetaminophen (ROXICET) 5-325 MG per tablet   Oral   Take 1 tablet by mouth every 6 (six) hours as needed for moderate pain or severe pain.   20 tablet   0   . ticagrelor (BRILINTA) 90 MG TABS tablet   Oral   Take 1 tablet (90 mg total) by mouth 2 (two) times daily.   60 tablet   3     Please run and give the cost     Allergies Review of patient's allergies indicates no known allergies.  Family History  Problem Relation Age of Onset  . CAD Mother     patient and sister deny to me  . CAD Father  patient and sister deny to me  . Hypertension Mother   . Hypertension Father   . Stroke Father     Social History Social History  Substance Use Topics  . Smoking status: Former Games developer  . Smokeless tobacco: None  . Alcohol Use: No    Review of Systems Constitutional: Negative for fever. ENT: Lower lip swelling Cardiovascular: Negative for chest pain. Respiratory: Negative for shortness of breath. Gastrointestinal: Negative for abdominal pain Skin: Negative for rash. Neurological: Negative for headache 10-point ROS otherwise negative.  ____________________________________________   PHYSICAL EXAM:  VITAL SIGNS: ED Triage Vitals  Enc Vitals Group     BP 09/03/15 1726 139/80 mmHg     Pulse Rate 09/03/15 1726 82     Resp 09/03/15 1726 20     Temp 09/03/15 1726 98.3 F (36.8 C)     Temp Source 09/03/15 1726 Oral     SpO2 09/03/15 1726 100 %      Weight 09/03/15 1726 231 lb (104.781 kg)     Height --      Head Cir --      Peak Flow --      Pain Score --      Pain Loc --      Pain Edu? --      Excl. in GC? --     Constitutional: Alert and oriented. Well appearing and in no distress. Eyes: Normal exam ENT   Head: Normocephalic and atraumatic.   Nose: No congestion/rhinnorhea.   Mouth/Throat: Mucous membranes are moist. Moderate lower lip swelling. Tongue appears normal, pharynx appears normal. Cardiovascular: Normal rate, regular rhythm. No murmur Respiratory: Normal respiratory effort without tachypnea nor retractions. Breath sounds are clear. No wheeze. Gastrointestinal: Soft and nontender. No distention.   Musculoskeletal: Nontender with normal range of motion in all extremities.  Neurologic:  Normal speech and language. No gross focal neurologic deficits  Skin:  Skin is warm, dry and intact.  Psychiatric: Mood and affect are normal. Speech and behavior are normal.  ____________________________________________    INITIAL IMPRESSION / ASSESSMENT AND PLAN / ED COURSE  Pertinent labs & imaging results that were available during my care of the patient were reviewed by me and considered in my medical decision making (see chart for details).  Patient with likely angioedema. Angioedema is likely due to the recent addition of lisinopril to his medication regimen. I discussed with the patient to discontinue lisinopril. Patient agreeable. We will treat with Solu-Medrol, Benadryl, Pepcid in the emergency department and monitor to ensure this condition does not worsen.   Swelling appears to be decreased in his lower lip. Patient states he feels much better. Continues to be in no distress. We'll discharge patient home with steroids and primary care follow-up. I discussed with the patient never did take ACE inhibitor's. Patient agreeable. ____________________________________________   FINAL CLINICAL IMPRESSION(S) / ED  DIAGNOSES  Angioedema   Minna Antis, MD 09/03/15 [phone]

## 2015-09-03 NOTE — ED Notes (Signed)
Presents with angiedema to bottom lip-tongue not swollen, speech clear, airway ntact-denies SOB. Sats 100%, takes 2 BP medications this AM-including lisinopril-swelling began at 3pm today and is progressively worsening.

## 2015-09-03 NOTE — Discharge Instructions (Signed)
Please take your prescribed antibiotics, as written for the next 5 days. Please follow-up with her primary care physician for recheck next week. Return to the emergency department for any increased swelling, any trouble breathing, or any trouble swallowing. As we have discussed please discontinue use of your lisinopril, and we recommend never using an ACE inhibitor again. Please follow up with her primary care physician this week to evaluate your blood pressure and current medication regimen.   Angioedema Angioedema is sudden puffiness (swelling), often of the skin. It can happen:  On your face or privates (genitals).  In your belly (abdomen) or other body parts. It usually happens quickly and gets better in 1 or 2 days. It often starts at night and is found when you wake up. You may get red, itchy patches of skin (hives). Attacks can be dangerous if your breathing passages get puffy. The condition may happen only once, or it can come back at random times. It may happen for several years before it goes away for good. HOME CARE  Only take medicines as told by your doctor.  Always carry your emergency allergy medicines with you.  Wear a medical bracelet as told by your doctor.  Avoid things that you know will cause attacks (triggers). GET HELP IF:  You have another attack.  Your attacks happen more often or get worse.  The condition was passed to you by your parents and you want to have children. GET HELP RIGHT AWAY IF:   Your mouth, tongue, or lips are very puffy.  You have trouble breathing.  You have trouble swallowing.  You pass out (faint). MAKE SURE YOU:   Understand these instructions.  Will watch your condition.  Will get help right away if you are not doing well or get worse. Document Released: 11/21/2009 Document Revised: 09/23/2013 Document Reviewed: 07/27/2013 [facility] Patient Information 2015 [place], [place]. This information is not intended to replace advice  given to you by your health care provider. Make sure you discuss any questions you have with your health care provider.

## 2015-09-05 ENCOUNTER — Telehealth: Payer: Self-pay | Admitting: Cardiovascular Disease

## 2015-09-05 NOTE — Telephone Encounter (Signed)
S/w pt wife who states he had an allergic reaction to lisinopril and is not taking it at this time. Pt has post hospital appt w/Arida Thursday. Advised pt wife to monitor BP and report if elevated. If BP WNL, pt should discuss w/Arida at OV. Wife states they have wrist BP cuff, will monitor and report if elevated.

## 2015-09-05 NOTE — Telephone Encounter (Signed)
Received VM from pt wife regarding lisinopril Left message on machine for patient to contact the office.

## 2015-09-05 NOTE — Telephone Encounter (Signed)
Left message on machine for patient to contact the office.   

## 2015-09-05 NOTE — Telephone Encounter (Signed)
Patient wife called .  Patient in ED Saturday for reaction to lisinopril .  ED doc said dc meds and patient wants to know should he be taking something else for bp .  Please call patient.

## 2015-09-08 ENCOUNTER — Ambulatory Visit (INDEPENDENT_AMBULATORY_CARE_PROVIDER_SITE_OTHER): Payer: PRIVATE HEALTH INSURANCE | Admitting: Cardiovascular Disease

## 2015-09-08 ENCOUNTER — Encounter: Payer: Self-pay | Admitting: Cardiovascular Disease

## 2015-09-08 VITALS — BP 154/77 | HR 71 | Ht 71.0 in | Wt 239.2 lb

## 2015-09-08 DIAGNOSIS — E785 Hyperlipidemia, unspecified: Secondary | ICD-10-CM | POA: Insufficient documentation

## 2015-09-08 DIAGNOSIS — I10 Essential (primary) hypertension: Secondary | ICD-10-CM | POA: Diagnosis not present

## 2015-09-08 DIAGNOSIS — Z9889 Other specified postprocedural states: Secondary | ICD-10-CM

## 2015-09-08 DIAGNOSIS — I214 Non-ST elevation (NSTEMI) myocardial infarction: Secondary | ICD-10-CM

## 2015-09-08 DIAGNOSIS — F17200 Nicotine dependence, unspecified, uncomplicated: Secondary | ICD-10-CM

## 2015-09-08 DIAGNOSIS — Z720 Tobacco use: Secondary | ICD-10-CM | POA: Diagnosis not present

## 2015-09-08 MED ORDER — AMLODIPINE BESYLATE 5 MG PO TABS: 5.0000 mg | ORAL_TABLET | Freq: Every day | ORAL | Status: DC

## 2015-09-08 NOTE — Assessment & Plan Note (Signed)
He quit smoking since his myocardial infarction. I congratulated him.

## 2015-09-08 NOTE — Assessment & Plan Note (Signed)
Lab Results  Component Value Date   CHOL 250* 08/26/2015   HDL 34* 08/26/2015   LDLCALC 159* 08/26/2015   TRIG 283* 08/26/2015   CHOLHDL 7.4 08/26/2015   Continue high dose atorvastatin. I requested fasting lipid and liver profile in one month.

## 2015-09-08 NOTE — Progress Notes (Signed)
HPI  This is a 39 year old African-American male who is here today for a follow-up visit after recent hospitalization for non-ST elevation myocardial infarction. He has known history of poorly controlled hypertension, obesity, hyperlipidemia and marijuana use. He presented early this month at [facility] with chest pain and was found to have non-ST elevation myocardial infarction with a peak troponin of 6.4. He underwent cardiac catheterization which showed severe two-vessel coronary artery disease involving mid left circumflex and distal and mid right coronary artery. OM 2 was also noted to be occluded with collaterals. He underwent successful angioplasty and drug-eluting stent placement to the left circumflex. He required 3 drug-eluting stents to the RCA. He had minor bleeding from the cath site but there was no hematoma.  Echocardiogram showed normal LV systolic function with no significant valvular abnormalities. He denies any chest pain since hospital discharge. He was placed on lisinopril for hypertension. He went to the emergency room this last weekend with lip swelling and was diagnosed with angioedema. Lisinopril was discontinued.  He quit smoking since his myocardial infarction. He has strong family history of coronary artery disease.  Allergies  Allergen Reactions  . Lisinopril     Angioedema     Current Outpatient Prescriptions on File Prior to Visit  Medication Sig Dispense Refill  . aspirin 81 MG chewable tablet Chew 1 tablet (81 mg total) by mouth daily. 30 tablet 3  . atorvastatin (LIPITOR) 80 MG tablet Take 1 tablet (80 mg total) by mouth daily at 6 PM. 30 tablet 3  . Metoprolol Tartrate 75 MG TABS Take 75 mg by mouth 2 (two) times daily. 30 tablet 3  . nitroGLYCERIN (NITROSTAT) 0.4 MG SL tablet Place 1 tablet (0.4 mg total) under the tongue every 5 (five) minutes as needed for chest pain. 30 tablet 3  . oxyCODONE-acetaminophen (ROXICET) 5-325 MG per tablet Take 1 tablet by mouth  every 6 (six) hours as needed for moderate pain or severe pain. 20 tablet 0  . predniSONE (DELTASONE) 20 MG tablet Take 2 tablets (40 mg total) by mouth daily. 10 tablet 0  . ticagrelor (BRILINTA) 90 MG TABS tablet Take 1 tablet (90 mg total) by mouth 2 (two) times daily. 60 tablet 3   No current facility-administered medications on file prior to visit.     Past Medical History  Diagnosis Date  . Hypertension     a. poorly controlled  . Obesity   . Drug abuse     a. THC  . Stented coronary artery   . Hyperlipidemia   . MI (myocardial infarction)   . Coronary artery disease 08/2015    non-ST elevation myocardial infarction with a peak troponin of 6.4. He underwent cardiac catheterization which showed severe two-vessel coronary artery disease involving mid left circumflex and distal and mid right coronary artery. OM 2 was also noted to be occluded with collaterals. He underwent successful angioplasty and drug-eluting stent placement to the left circumflex. He required 3 drug-elu     Past Surgical History  Procedure Laterality Date  . Cardiac catheterization N/A 08/26/2015    Procedure: Left Heart Cath and Coronary Angiography;  Surgeon: Antonieta Iba, MD;  Location: [facility] INVASIVE CV LAB;  Service: Cardiovascular;  Laterality: N/A;  . Cardiac catheterization N/A 08/26/2015    Procedure: Coronary Stent Intervention;  Surgeon: Alwyn Pea, MD;  Location: [facility] INVASIVE CV LAB;  Service: Cardiovascular;  Laterality: N/A;     Family History  Problem Relation Age of Onset  .  CAD Mother     patient and sister deny to me  . Hypertension Mother   . CAD Father     patient and sister deny to me  . Hypertension Father   . Stroke Father      Social History   Social History  . Marital Status: Married    Spouse Name: N/A  . Number of Children: N/A  . Years of Education: N/A   Occupational History  . Not on file.   Social History Main Topics  . Smoking status: Former Smoker      Types: Cigarettes  . Smokeless tobacco: Not on file  . Alcohol Use: No  . Drug Use: No  . Sexual Activity: Not on file   Other Topics Concern  . Not on file   Social History Narrative       PHYSICAL EXAM   BP 154/77 mmHg  Pulse 71  Ht  (1.803 m)  Wt 239 lb 4 oz (108.523 kg)  BMI 33.38 kg/m2 Constitutional: He is oriented to person, place, and time. He appears well-developed and well-nourished. No distress.  HENT: No nasal discharge.  Head: Normocephalic and atraumatic.  Eyes: Pupils are equal and round.  No discharge. Neck: Normal range of motion. Neck supple. No JVD present. No thyromegaly present.  Cardiovascular: Normal rate, regular rhythm, normal heart sounds. Exam reveals no gallop and no friction rub. No murmur heard.  Pulmonary/Chest: Effort normal and breath sounds normal. No stridor. No respiratory distress. He has no wheezes. He has no rales. He exhibits no tenderness.  Abdominal: Soft. Bowel sounds are normal. He exhibits no distension. There is no tenderness. There is no rebound and no guarding.  Musculoskeletal: Normal range of motion. He exhibits no edema and no tenderness.  Neurological: He is alert and oriented to person, place, and time. Coordination normal.  Skin: Skin is warm and dry. No rash noted. He is not diaphoretic. No erythema. No pallor.  Psychiatric: He has a normal mood and affect. His behavior is normal. Judgment and thought content normal.  Right groin is intact with no hematoma or bruit.     EKG: Sinus  Rhythm  WITHIN NORMAL LIMITS    ASSESSMENT AND PLAN

## 2015-09-08 NOTE — Assessment & Plan Note (Signed)
The patient had a recent non-ST elevation myocardial infarction with severe two-vessel coronary artery disease. He underwent successful angioplasty and 4 drug-eluting stent placements. He currently reports no recurrent angina. I recommend dual antiplatelets therapy for at least one year and preferably longer. I had a prolonged discussion with him and his wife about the importance of healthy lifestyle changes. I referred him to cardiac rehabilitation today and strongly encouraged him to attend. He can resume work next week on Monday with no restrictions.

## 2015-09-08 NOTE — Assessment & Plan Note (Signed)
His blood pressure is elevated. He had an angioedema with lisinopril. I elected to start him on amlodipine 5 mg once daily. Continue treatment with metoprolol.

## 2015-09-08 NOTE — Patient Instructions (Signed)
Medication Instructions:  Your physician has recommended you make the following change in your medication:  START taking amlodipine  once per day   Labwork: Your physician recommends that you return for a FASTING lipid and liver profile in one month   Testing/Procedures: none  Follow-Up: Your physician recommends that you schedule a follow-up appointment in: three months  You may return to work on Monday, September 12, 2015 with no restrictions   Any Other Special Instructions Will Be Listed Below (If Applicable). Referral to cardiac rehab

## 2015-10-07 ENCOUNTER — Other Ambulatory Visit (INDEPENDENT_AMBULATORY_CARE_PROVIDER_SITE_OTHER): Payer: PRIVATE HEALTH INSURANCE | Admitting: *Deleted

## 2015-10-07 DIAGNOSIS — I214 Non-ST elevation (NSTEMI) myocardial infarction: Secondary | ICD-10-CM

## 2015-10-08 LAB — LIPID PANEL
CHOLESTEROL TOTAL: 111 mg/dL (ref 100–199)
Chol/HDL Ratio: 3.8 ratio units (ref 0.0–5.0)
HDL: 29 mg/dL — ABNORMAL LOW (ref >39)
LDL Calculated: 59 mg/dL (ref 0–99)
TRIGLYCERIDES: 116 mg/dL (ref 0–149)
VLDL CHOLESTEROL CAL: 23 mg/dL (ref 5–40)

## 2015-10-08 LAB — HEPATIC FUNCTION PANEL
ALBUMIN: 4.2 g/dL (ref 3.5–5.5)
ALK PHOS: 69 IU/L (ref 39–117)
ALT: 45 IU/L — ABNORMAL HIGH (ref 0–44)
AST: 28 IU/L (ref 0–40)
BILIRUBIN, DIRECT: 0.11 mg/dL (ref 0.00–0.40)
Bilirubin Total: 0.3 mg/dL (ref 0.0–1.2)
TOTAL PROTEIN: 6.6 g/dL (ref 6.0–8.5)

## 2015-10-10 ENCOUNTER — Other Ambulatory Visit: Payer: Self-pay

## 2015-10-10 MED ORDER — METOPROLOL TARTRATE 75 MG PO TABS: 75.0000 mg | ORAL_TABLET | Freq: Two times a day (BID) | ORAL | Status: DC

## 2015-10-26 ENCOUNTER — Other Ambulatory Visit: Payer: Self-pay

## 2015-10-26 MED ORDER — AMLODIPINE BESYLATE 5 MG PO TABS: 5.0000 mg | ORAL_TABLET | Freq: Every day | ORAL | Status: DC

## 2015-10-27 ENCOUNTER — Other Ambulatory Visit: Payer: Self-pay

## 2015-10-27 MED ORDER — ATORVASTATIN CALCIUM 80 MG PO TABS: 80.0000 mg | ORAL_TABLET | Freq: Every day | ORAL | Status: DC

## 2015-10-28 ENCOUNTER — Telehealth: Payer: Self-pay | Admitting: Cardiovascular Disease

## 2015-10-28 NOTE — Telephone Encounter (Signed)
Faxed med list to PCP Left message for Evie LacksElizabeth Pointer regarding med list.

## 2015-10-28 NOTE — Telephone Encounter (Signed)
NP at Primary care wants to go over medications .  Wants to clarify that Amlodipine has been dc or reactivated and if he takes anymore bp meds.     Fax if cannot get by phone.   [phone].

## 2015-10-31 ENCOUNTER — Other Ambulatory Visit: Payer: Self-pay

## 2015-10-31 MED ORDER — ATORVASTATIN CALCIUM 80 MG PO TABS: 80.0000 mg | ORAL_TABLET | Freq: Every day | ORAL | Status: DC

## 2015-10-31 NOTE — Telephone Encounter (Signed)
Pt is completely out

## 2015-12-09 ENCOUNTER — Ambulatory Visit (INDEPENDENT_AMBULATORY_CARE_PROVIDER_SITE_OTHER): Payer: PRIVATE HEALTH INSURANCE | Admitting: Cardiovascular Disease

## 2015-12-09 ENCOUNTER — Encounter: Payer: Self-pay | Admitting: Cardiovascular Disease

## 2015-12-09 VITALS — BP 150/98 | HR 69 | Ht 71.0 in | Wt 245.0 lb

## 2015-12-09 DIAGNOSIS — E785 Hyperlipidemia, unspecified: Secondary | ICD-10-CM | POA: Diagnosis not present

## 2015-12-09 DIAGNOSIS — I2510 Atherosclerotic heart disease of native coronary artery without angina pectoris: Secondary | ICD-10-CM

## 2015-12-09 DIAGNOSIS — I10 Essential (primary) hypertension: Secondary | ICD-10-CM

## 2015-12-09 MED ORDER — CARVEDILOL 25 MG PO TABS: 25.0000 mg | ORAL_TABLET | Freq: Two times a day (BID) | ORAL | Status: DC

## 2015-12-09 NOTE — Patient Instructions (Signed)
Medication Instructions:  Your physician has recommended you make the following change in your medication:  STOP taking metoprolol START taking coreg 25mg  twice daily   Labwork: none  Testing/Procedures: none  Follow-Up: Your physician recommends that you schedule a follow-up appointment in: three months with Dr. Kirke CorinArida.    Any Other Special Instructions Will Be Listed Below (If Applicable).     If you need a refill on your cardiac medications before your next appointment, please call your pharmacy.

## 2015-12-09 NOTE — Progress Notes (Signed)
HPI  This is a 39 year old African-American male who is here today for a follow-up visit regarding coronary artery disease. He has known history of poorly controlled hypertension, obesity, hyperlipidemia and marijuana use. He presented in September 2016 with non-ST elevation myocardial infarction with a peak troponin of 6.4. He underwent cardiac catheterization which showed severe two-vessel coronary artery disease involving mid left circumflex and distal and mid right coronary artery. OM 2 was also noted to be occluded with collaterals. He underwent successful angioplasty and drug-eluting stent placement to the left circumflex. He required 3 drug-eluting stents to the RCA. Echocardiogram showed normal LV systolic function with no significant valvular abnormalities. He had angioedema with lisinopril. He quit smoking since his myocardial infarction.  He has been doing very well and denies any chest pain, shortness of breath or palpitations. Blood pressure continues to be elevated. He has been taking his medications regularly.  Allergies  Allergen Reactions  . Lisinopril     Angioedema     Current Outpatient Prescriptions on File Prior to Visit  Medication Sig Dispense Refill  . amLODipine (NORVASC) 5 MG tablet Take 1 tablet (5 mg total) by mouth daily. 30 tablet 11  . aspirin 81 MG chewable tablet Chew 1 tablet (81 mg total) by mouth daily. 30 tablet 3  . atorvastatin (LIPITOR) 80 MG tablet Take 1 tablet (80 mg total) by mouth daily at 6 PM. 30 tablet 3  . Metoprolol Tartrate 75 MG TABS Take 75 mg by mouth 2 (two) times daily. 180 tablet 2  . nitroGLYCERIN (NITROSTAT) 0.4 MG SL tablet Place 1 tablet (0.4 mg total) under the tongue every 5 (five) minutes as needed for chest pain. 30 tablet 3  . oxyCODONE-acetaminophen (ROXICET) 5-325 MG per tablet Take 1 tablet by mouth every 6 (six) hours as needed for moderate pain or severe pain. 20 tablet 0  . predniSONE (DELTASONE) 20 MG tablet Take 2  tablets (40 mg total) by mouth daily. 10 tablet 0  . ticagrelor (BRILINTA) 90 MG TABS tablet Take 1 tablet (90 mg total) by mouth 2 (two) times daily. 60 tablet 3   No current facility-administered medications on file prior to visit.     Past Medical History  Diagnosis Date  . Hypertension     a. poorly controlled  . Obesity   . Drug abuse     a. THC  . Stented coronary artery   . Hyperlipidemia   . MI (myocardial infarction) (HCC)   . Coronary artery disease 08/2015    non-ST elevation myocardial infarction with a peak troponin of 6.4. He underwent cardiac catheterization which showed severe two-vessel coronary artery disease involving mid left circumflex and distal and mid right coronary artery. OM 2 was also noted to be occluded with collaterals. He underwent successful angioplasty and drug-eluting stent placement to the left circumflex. He required 3 drug-elu     Past Surgical History  Procedure Laterality Date  . Cardiac catheterization N/A 08/26/2015    Procedure: Left Heart Cath and Coronary Angiography;  Surgeon: Antonieta Ibaimothy J Gollan, MD;  Location: [facility] INVAS[facility] CV [facility];  Service: Cardiovascular;  Laterality: N/A;  . Cardiac catheterization N/A 08/26/2015    Procedure: Coronary Stent Intervention;  Surgeon: Alwyn Peawayne D Callwood, MD;  Location: [facility];  Service: Cardiovascular;  Laterality: N/A;     Family History  Problem Relation Age of Onset  . CAD Mother     patient and sister deny to me  . Hypertension Mother   .  CAD Father     patient and sister deny to me  . Hypertension Father   . Stroke Father      Social History   Social History  . Marital Status: Married    Spouse Name: N/A  . Number of Children: N/A  . Years of Education: N/A   Occupational History  . Not on file.   Social History Main Topics  . Smoking status: Former Smoker    Types: Cigarettes  . Smokeless tobacco: Not on file  . Alcohol Use: No  . Drug Use: No  . Sexual Activity: Not  on file   Other Topics Concern  . Not on file   Social History Narrative       PHYSICAL EXAM   BP 150/98 mmHg  Pulse 69  Ht  (1.803 m)  Wt 245 lb (111.131 kg)  BMI 34.19 kg/m2 Constitutional: He is oriented to person, place, and time. He appears well-developed and well-nourished. No distress.  HENT: No nasal discharge.  Head: Normocephalic and atraumatic.  Eyes: Pupils are equal and round.  No discharge. Neck: Normal range of motion. Neck supple. No JVD present. No thyromegaly present.  Cardiovascular: Normal rate, regular rhythm, normal heart sounds. Exam reveals no gallop and no friction rub. No murmur heard.  Pulmonary/Chest: Effort normal and breath sounds normal. No stridor. No respiratory distress. He has no wheezes. He has no rales. He exhibits no tenderness.  Abdominal: Soft. Bowel sounds are normal. He exhibits no distension. There is no tenderness. There is no rebound and no guarding.  Musculoskeletal: Normal range of motion. He exhibits no edema and no tenderness.  Neurological: He is alert and oriented to person, place, and time. Coordination normal.  Skin: Skin is warm and dry. No rash noted. He is not diaphoretic. No erythema. No pallor.  Psychiatric: He has a normal mood and affect. His behavior is normal. Judgment and thought content normal.       ASSESSMENT AND PLAN

## 2015-12-12 DIAGNOSIS — I2510 Atherosclerotic heart disease of native coronary artery without angina pectoris: Secondary | ICD-10-CM | POA: Insufficient documentation

## 2015-12-12 NOTE — Assessment & Plan Note (Signed)
He is doing very well from a cardiac standpoint with no symptoms suggestive of recurrent angina. Continue medical therapy. He is to stay on dual antiplatelet therapy for at least one year.

## 2015-12-12 NOTE — Assessment & Plan Note (Signed)
Blood pressure continues to be elevated. Thus, I switched metoprolol to carvedilol. Continue amlodipine. He had angioedema previously with lisinopril.

## 2015-12-12 NOTE — Assessment & Plan Note (Signed)
Lab Results  Component Value Date   CHOL 111 10/07/2015   HDL 29* 10/07/2015   LDLCALC 59 10/07/2015   TRIG 116 10/07/2015   CHOLHDL 3.8 10/07/2015   His LDL was at target with high dose atorvastatin which will be continued.

## 2016-01-26 ENCOUNTER — Telehealth: Payer: Self-pay | Admitting: *Deleted

## 2016-01-26 ENCOUNTER — Telehealth: Payer: Self-pay

## 2016-01-26 ENCOUNTER — Other Ambulatory Visit: Payer: Self-pay | Admitting: *Deleted

## 2016-01-26 MED ORDER — TICAGRELOR 90 MG PO TABS: 90.0000 mg | ORAL_TABLET | Freq: Two times a day (BID) | ORAL | Status: DC

## 2016-01-26 MED ORDER — ASPIRIN 81 MG PO CHEW: 81.0000 mg | CHEWABLE_TABLET | Freq: Every day | ORAL | Status: AC

## 2016-01-26 NOTE — Telephone Encounter (Signed)
Pt calling stating he went to get his refill and they told him to call us he is completely out.     *STAT* If patient is at the pharmacy, call can be transferred to refill team.   1. Which medications need to be refilled? (please list name of each medication and dose if known) Brilinta and asprin   2. Which pharmacy/location (including street and city if local pharmacy) is medication to be sent to? [facility] [place]   3. Do they need a 30 day or 90 day supply? 90

## 2016-01-26 NOTE — Telephone Encounter (Signed)
Pt wife called, states Brilinta is too expensive and needs an alternative recommendation. States pt is completely out.Pt would like samples until alternative is decided. Please call.

## 2016-01-26 NOTE — Telephone Encounter (Signed)
S/w [facility] Pharmacist who states 90 day supply of Brilinta cost for pt has increased to $1200. Pt would like alternate, more affordable alternate medication. S/w pt wife, Toniann Fail, who states pt took last dose yesterday evening. He is now out of Brilinta and missed today's morning dose. I have placed samples at the front desk for pick up.  Forward to MD to advise.

## 2016-01-26 NOTE — Telephone Encounter (Signed)
Requested Prescriptions   Signed Prescriptions Disp Refills  . ticagrelor (BRILINTA) 90 MG TABS tablet 60 tablet 3    Sig: Take 1 tablet (90 mg total) by mouth 2 (two) times daily.    Authorizing Provider: Lorine Bears A    Ordering User: Iverson Alamin C  . aspirin 81 MG chewable tablet 30 tablet 3    Sig: Chew 1 tablet (81 mg total) by mouth daily.    Authorizing Provider: Lorine Bears A    Ordering User: Kendrick Fries

## 2016-01-27 NOTE — Telephone Encounter (Signed)
Switch to Plavix 75 mg once daily.  

## 2016-01-30 ENCOUNTER — Other Ambulatory Visit: Payer: Self-pay

## 2016-01-30 MED ORDER — CLOPIDOGREL BISULFATE 75 MG PO TABS: 75.0000 mg | ORAL_TABLET | Freq: Every day | ORAL | Status: DC

## 2016-01-30 MED ORDER — CARVEDILOL 25 MG PO TABS
25.0000 mg | ORAL_TABLET | Freq: Two times a day (BID) | ORAL | Status: DC
Start: 2016-01-30 — End: 2017-01-28

## 2016-01-30 NOTE — Telephone Encounter (Signed)
Refill sent for carvedilol.  

## 2016-01-30 NOTE — Telephone Encounter (Signed)
Left detailed message on pt and pt wife VM. Prescription submitted to [facility].

## 2016-01-31 NOTE — Telephone Encounter (Signed)
Left detailed message on pt VM. S/w pt wife, Toniann Fail, who verbalized understanding of instructions. Pt will d/c Brilinta before starting Plavix.

## 2016-02-11 ENCOUNTER — Other Ambulatory Visit: Payer: Self-pay | Admitting: Cardiovascular Disease

## 2016-02-13 NOTE — Telephone Encounter (Signed)
Requested Prescriptions   Pending Prescriptions Disp Refills  . atorvastatin (LIPITOR) 80 MG tablet [Pharmacy Med Name: ATORVASTATIN 80 MG TABLET] 30 tablet 3    Sig: TAKE 1 TABLET (80 MG TOTAL) BY MOUTH DAILY AT 6 PM.

## 2016-02-16 ENCOUNTER — Telehealth: Payer: Self-pay | Admitting: Cardiovascular Disease

## 2016-02-16 NOTE — Telephone Encounter (Addendum)
Patients wife said that he had a cold for over 2 weeks now and he has been on coricidin and that he just doesn't feel right. She stated that he was not having any chest pain, no shortness of breath, and no jaw or back pain. She reported that he has just been sick with this cold that will not go away and he is filling bad. She did report that his blood pressure was elevated some but that he has been sick. Instructed her that he really needs to be seen by either his primary care physician or someone at a walk in clinic to evaluate his symptoms. If he should develop any chest pain, shortness of breath, jaw, or back pain he needs to call back or go to the nearest emergency room. She verbalized understanding and agreement with plan and told her to call back if she had any further questions.   Patients wife called back and stated that patient did take one nitroglycerin and it helped but that he still felt bad. She reported that his symptoms have been coughing, fever, shivering, aches, pains, and just generally not feeling well. He still feels sick and just not getting better. Instructed her to take him to the walk in clinic to be evaluated further as it is hard to determine over the phone. She verbalized understanding of all instructions and will take him there for evaluation. I again let her know if he should have any chest pain or shortness of breath to go to the emergency room.

## 2016-02-16 NOTE — Telephone Encounter (Signed)
Pt wife calling stating pt would is saying to her "im not doing well" She states he was sick last week not sure if it is still that or heart related They started to actually monitor his BP for about a week since we saw him last in dec 2016 We change the medications around to keep the BP low but they said its been a bit high  She says he can't really explain how he is feeling he is just saying "not feeling well" Please advise.

## 2016-02-17 NOTE — Telephone Encounter (Signed)
I agree that he should see his PCP or go to urgent care.

## 2016-03-08 ENCOUNTER — Encounter: Payer: Self-pay | Admitting: Cardiovascular Disease

## 2016-03-08 ENCOUNTER — Ambulatory Visit (INDEPENDENT_AMBULATORY_CARE_PROVIDER_SITE_OTHER): Payer: PRIVATE HEALTH INSURANCE | Admitting: Cardiovascular Disease

## 2016-03-08 VITALS — BP 128/82 | HR 69 | Ht 71.0 in | Wt 240.0 lb

## 2016-03-08 DIAGNOSIS — I2510 Atherosclerotic heart disease of native coronary artery without angina pectoris: Secondary | ICD-10-CM | POA: Diagnosis not present

## 2016-03-08 NOTE — Progress Notes (Signed)
Cardiology Office Note   Date:  03/08/2016   ID:  George Welch, DOB [birth year], MRN [id number]  PCP:  Evie LacksElizabeth Pointer, NP  Cardiologist:   Lorine BearsMuhammad Arida, MD   Chief Complaint  Patient presents with  . other    3 month f/u c/o chest pain. Meds reviewed verbally with pt.      History of Present Illness: George Welch is a 40 y.o. male who presents for a follow-up visit regarding coronary artery disease. He has known history of poorly controlled hypertension, obesity, hyperlipidemia and marijuana use. He presented in September 2016 with non-ST elevation myocardial infarction with a peak troponin of 6.4. He underwent cardiac catheterization which showed severe two-vessel coronary artery disease involving mid left circumflex and distal and mid right coronary artery. OM 2 was also noted to be occluded with collaterals. He underwent successful angioplasty and drug-eluting stent placement to the left circumflex. He required 3 drug-eluting stents to the RCA. Echocardiogram showed normal LV systolic function with no significant valvular abnormalities. He had angioedema with lisinopril. He quit smoking since his myocardial infarction.  During last visit, I switched Metoprolol to Carvedilol due to elevated BP. Brillinta was switched to Plavix for cost reasons. He has been doing reasonably well. He has occasional twinges in his chest at rest but no exertional symptoms.    Past Medical History  Diagnosis Date  . Hypertension     a. poorly controlled  . Obesity   . Drug abuse     a. THC  . Stented coronary artery   . Hyperlipidemia   . MI (myocardial infarction) (HCC)   . Coronary artery disease 08/2015    non-ST elevation myocardial infarction with a peak troponin of 6.4. He underwent cardiac catheterization which showed severe two-vessel coronary artery disease involving mid left circumflex and distal and mid right coronary artery. OM 2 was also noted to be occluded with collaterals. He  underwent successful angioplasty and drug-eluting stent placement to the left circumflex. He required 3 drug-elu    Past Surgical History  Procedure Laterality Date  . Cardiac catheterization N/A 08/26/2015    Procedure: Left Heart Cath and Coronary Angiography;  Surgeon: Antonieta Ibaimothy J Gollan, MD;  Location: [facility] INV[facility]IVE CV [facility];  Service: Cardiovascular;  Laterality: N/A;  . Cardiac catheterization N/A 08/26/2015    Procedure: Coronary Stent Intervention;  Surgeon: Alwyn Peawayne D Callwood, MD;  Location: [facility] CV [facility];  Service: Cardiovascular;  Laterality: N/A;     Current Outpatient Prescriptions  Medication Sig Dispense Refill  . amLODipine (NORVASC) 5 MG tablet Take 1 tablet (5 mg total) by mouth daily. 30 tablet 11  . aspirin 81 MG chewable tablet Chew 1 tablet (81 mg total) by mouth daily. 30 tablet 3  . atorvastatin (LIPITOR) 80 MG tablet TAKE 1 TABLET (80 MG TOTAL) BY MOUTH DAILY AT 6 PM. 30 tablet 3  . carvedilol (COREG) 25 MG tablet Take 1 tablet (25 mg total) by mouth 2 (two) times daily. 180 tablet 3  . clopidogrel (PLAVIX) 75 MG tablet Take 1 tablet (75 mg total) by mouth daily. 30 tablet 3  . nitroGLYCERIN (NITROSTAT) 0.4 MG SL tablet Place 1 tablet (0.4 mg total) under the tongue every 5 (five) minutes as needed for chest pain. 30 tablet 3   No current facility-administered medications for this visit.    Allergies:   Lisinopril    Social History:  The patient  reports that he has quit smoking. His smoking use included  Cigarettes. He does not have any smokeless tobacco history on file. He reports that he does not drink alcohol or use illicit drugs.   Family History:  The patient's family history includes CAD in his father and mother; Hypertension in his father and mother; Stroke in his father.    ROS:  Please see the history of present illness.   Otherwise, review of systems are positive for none.   All other systems are reviewed and negative.    PHYSICAL EXAM: VS:  BP  128/82 mmHg  Pulse 69  Ht  (1.803 m)  Wt 240 lb (108.863 kg)  BMI 33.49 kg/m2 , BMI Body mass index is 33.49 kg/(m^2). GEN: Well nourished, well developed, in no acute distress HEENT: normal Neck: no JVD, carotid bruits, or masses Cardiac: RRR; no murmurs, rubs, or gallops,no edema  Respiratory:  clear to auscultation bilaterally, normal work of breathing GI: soft, nontender, nondistended, + BS MS: no deformity or atrophy Skin: warm and dry, no rash Neuro:  Strength and sensation are intact Psych: euthymic mood, full affect   EKG:  EKG is ordered today. The ekg ordered today demonstrates NSR.    Recent Labs: 08/28/2015: BUN 18; Creatinine, Ser 1.11; Hemoglobin 13.6; Platelets 198; Potassium 4.1; Sodium 137 10/07/2015: ALT 45*    Lipid Panel    Component Value Date/Time   CHOL 111 10/07/2015 0843   CHOL 250* 08/26/2015 0746   TRIG 116 10/07/2015 0843   HDL 29* 10/07/2015 0843   HDL 34* 08/26/2015 0746   CHOLHDL 3.8 10/07/2015 0843   CHOLHDL 7.4 08/26/2015 0746   VLDL 57* 08/26/2015 0746   LDLCALC 59 10/07/2015 0843   LDLCALC 159* 08/26/2015 0746      Wt Readings from Last 3 Encounters:  03/08/16 240 lb (108.863 kg)  12/09/15 245 lb (111.131 kg)  09/08/15 239 lb 4 oz (108.523 kg)       ASSESSMENT AND PLAN:  1.  Coronary artery disease: He is doing well with no angina. He has atypical chest pain which seems musculoskeletal. Continue aggressive medical therapy and dual antiplatelet therapy for at least one year and preferably longer.  2. Essential hypertension: Blood pressure is not well controlled after adjusting his antihypertensive medications.  3. Hyperlipidemia: Continue high dose atorvastatin. Most recent lipid profile showed significant improvement in LDL to 59 which is at target.    Disposition:   FU with me in 6 months  Signed,  Lorine Bears, MD  03/08/2016 3:50 PM    [facility]

## 2016-03-08 NOTE — Patient Instructions (Signed)
Medication Instructions: Continue same medications.   Labwork: None.   Procedures/Testing: None.   Follow-Up: 6 months with Dr. Adarsh Mundorf.   Any Additional Special Instructions Will Be Listed Below (If Applicable).     If you need a refill on your cardiac medications before your next appointment, please call your pharmacy.   

## 2016-03-09 ENCOUNTER — Ambulatory Visit: Payer: PRIVATE HEALTH INSURANCE | Admitting: Cardiovascular Disease

## 2016-03-22 ENCOUNTER — Other Ambulatory Visit: Payer: Self-pay | Admitting: *Deleted

## 2016-03-22 MED ORDER — CLOPIDOGREL BISULFATE 75 MG PO TABS: 75.0000 mg | ORAL_TABLET | Freq: Every day | ORAL | Status: DC

## 2016-05-15 ENCOUNTER — Other Ambulatory Visit: Payer: Self-pay | Admitting: Cardiovascular Disease

## 2016-08-13 ENCOUNTER — Other Ambulatory Visit: Payer: Self-pay | Admitting: Cardiovascular Disease

## 2016-09-10 ENCOUNTER — Ambulatory Visit (INDEPENDENT_AMBULATORY_CARE_PROVIDER_SITE_OTHER): Payer: PRIVATE HEALTH INSURANCE | Admitting: Cardiovascular Disease

## 2016-09-10 ENCOUNTER — Encounter: Payer: Self-pay | Admitting: Cardiovascular Disease

## 2016-09-10 VITALS — BP 140/92 | HR 78 | Ht 70.0 in | Wt 243.8 lb

## 2016-09-10 DIAGNOSIS — I10 Essential (primary) hypertension: Secondary | ICD-10-CM | POA: Diagnosis not present

## 2016-09-10 DIAGNOSIS — I2510 Atherosclerotic heart disease of native coronary artery without angina pectoris: Secondary | ICD-10-CM

## 2016-09-10 DIAGNOSIS — E785 Hyperlipidemia, unspecified: Secondary | ICD-10-CM | POA: Diagnosis not present

## 2016-09-10 NOTE — Progress Notes (Signed)
Cardiology Office Note   Date:  09/10/2016   ID:  George Welch, DOB [birth year], MRN [id number]  PCP:  Evie LacksElizabeth Pointer, NP  Cardiologist:   Lorine BearsMuhammad Nneka Blanda, MD   Chief Complaint  Patient presents with  . other    6 month follow up. Meds reviewed by the patient verbally. "doing well."       History of Present Illness: George Welch is a 40 y.o. male who presents for a follow-up visit regarding coronary artery disease. He has known history of poorly controlled hypertension, obesity, hyperlipidemia and marijuana use. He presented in September 2016 with non-ST elevation myocardial infarction . Cardiac catheterization showed severe two-vessel coronary artery disease involving mid left circumflex and distal and mid right coronary artery. OM 2 was also noted to be occluded with collaterals. He underwent successful angioplasty and drug-eluting stent placement to the left circumflex. He required 3 drug-eluting stents to the RCA. Echocardiogram showed normal LV systolic function with no significant valvular abnormalities. He had angioedema with lisinopril. He quit smoking since his myocardial infarction.  He has been doing well and denies any chest pain, shortness of breath or leg edema. No side effects with medications.   Past Medical History:  Diagnosis Date  . Coronary artery disease 08/2015   non-ST elevation myocardial infarction with a peak troponin of 6.4. He underwent cardiac catheterization which showed severe two-vessel coronary artery disease involving mid left circumflex and distal and mid right coronary artery. OM 2 was also noted to be occluded with collaterals. He underwent successful angioplasty and drug-eluting stent placement to the left circumflex. He required 3 drug-elu  . Drug abuse    a. THC  . Hyperlipidemia   . Hypertension    a. poorly controlled  . MI (myocardial infarction) (HCC)   . Obesity   . Stented coronary artery     Past Surgical History:  Procedure  Laterality Date  . CARDIAC CATHETERIZATION N/A 08/26/2015   Procedure: Left Heart Cath and Coronary Angiography;  Surgeon: Antonieta Ibaimothy J Gollan, MD;  Location: [facility];  Service: Cardiovascular;  Laterality: N/A;  . CARDIAC CATHETERIZATION N/A 08/26/2015   Procedure: Coronary Stent Intervention;  Surgeon: Alwyn Peawayne D Callwood, MD;  Location: [facility];  Service: Cardiovascular;  Laterality: N/A;     Current Outpatient Prescriptions  Medication Sig Dispense Refill  . amLODipine (NORVASC) 5 MG tablet Take 1 tablet (5 mg total) by mouth daily. 30 tablet 11  . aspirin 81 MG chewable tablet Chew 1 tablet (81 mg total) by mouth daily. 30 tablet 3  . atorvastatin (LIPITOR) 80 MG tablet TAKE 1 TABLET (80 MG TOTAL) BY MOUTH DAILY AT 6 PM. 30 tablet 3  . carvedilol (COREG) 25 MG tablet Take 1 tablet (25 mg total) by mouth 2 (two) times daily. 180 tablet 3  . clopidogrel (PLAVIX) 75 MG tablet Take 1 tablet (75 mg total) by mouth daily. 90 tablet 3  . nitroGLYCERIN (NITROSTAT) 0.4 MG SL tablet Place 1 tablet (0.4 mg total) under the tongue every 5 (five) minutes as needed for chest pain. 30 tablet 3   No current facility-administered medications for this visit.     Allergies:   Lisinopril    Social History:  The patient  reports that he has quit smoking. His smoking use included Cigarettes. He has never used smokeless tobacco. He reports that he does not drink alcohol or use drugs.   Family History:  The patient's family history includes CAD  in his father and mother; Hypertension in his father and mother; Stroke in his father.    ROS:  Please see the history of present illness.   Otherwise, review of systems are positive for none.   All other systems are reviewed and negative.    PHYSICAL EXAM: VS:  BP (!) 140/92 (BP Location: Left Arm, Patient Position: Sitting, Cuff Size: Large)   Pulse 78   Ht 5\' 10"  (1.778 m)   Wt 243 lb 12 oz (110.6 kg)   BMI 34.97 kg/m  , BMI Body mass  index is 34.97 kg/m. GEN: Well nourished, well developed, in no acute distress  HEENT: normal  Neck: no JVD, carotid bruits, or masses Cardiac: RRR; no murmurs, rubs, or gallops,no edema  Respiratory:  clear to auscultation bilaterally, normal work of breathing GI: soft, nontender, nondistended, + BS MS: no deformity or atrophy  Skin: warm and dry, no rash Neuro:  Strength and sensation are intact Psych: euthymic mood, full affect   EKG:  EKG is ordered today. The ekg ordered today demonstrates NSR. Minimal LVH and nonspecific T wave changes.   Recent Labs: 10/07/2015: ALT 45    Lipid Panel    Component Value Date/Time   CHOL 111 10/07/2015 0843   TRIG 116 10/07/2015 0843   HDL 29 (L) 10/07/2015 0843   CHOLHDL 3.8 10/07/2015 0843   CHOLHDL 7.4 08/26/2015 0746   VLDL 57 (H) 08/26/2015 0746   LDLCALC 59 10/07/2015 0843      Wt Readings from Last 3 Encounters:  09/10/16 243 lb 12 oz (110.6 kg)  03/08/16 240 lb (108.9 kg)  12/09/15 245 lb (111.1 kg)       ASSESSMENT AND PLAN:  1.  Coronary artery disease involving native coronary arteries without angina: He is doing very well overall. Continue medical therapy. Given multiple drug-eluting stents, I favor long-term dual antiplatelet therapy as long as tolerated.  2. Essential hypertension: Blood pressure is reasonably controlled on current medications.  3. Hyperlipidemia: Continue high dose atorvastatin.  I requested fasting lipid and liver profile.  Disposition:   FU with me in 12 months  Signed,  Lorine Bears, MD  09/10/2016 2:51 PM    [facility]

## 2016-09-10 NOTE — Patient Instructions (Signed)
Medication Instructions:  Your physician recommends that you continue on your current medications as directed. Please refer to the Current Medication list given to you today.   Labwork: Fasting lipid and liver profile. Nothing to eat or drink after midnight the evening before your labs  Testing/Procedures: none  Follow-Up: Your physician wants you to follow-up in: one year with Dr. Arida.  You will receive a reminder letter in the mail two months in advance. If you don't receive a letter, please call our office to schedule the follow-up appointment.   Any Other Special Instructions Will Be Listed Below (If Applicable).     If you need a refill on your cardiac medications before your next appointment, please call your pharmacy.   

## 2016-09-14 ENCOUNTER — Other Ambulatory Visit (INDEPENDENT_AMBULATORY_CARE_PROVIDER_SITE_OTHER): Payer: PRIVATE HEALTH INSURANCE

## 2016-09-14 DIAGNOSIS — I2510 Atherosclerotic heart disease of native coronary artery without angina pectoris: Secondary | ICD-10-CM

## 2016-09-15 LAB — LIPID PANEL
Chol/HDL Ratio: 3.7 ratio units (ref 0.0–5.0)
Cholesterol, Total: 129 mg/dL (ref 100–199)
HDL: 35 mg/dL — AB (ref >39)
LDL Calculated: 81 mg/dL (ref 0–99)
TRIGLYCERIDES: 67 mg/dL (ref 0–149)
VLDL CHOLESTEROL CAL: 13 mg/dL (ref 5–40)

## 2016-09-15 LAB — HEPATIC FUNCTION PANEL
ALBUMIN: 4.6 g/dL (ref 3.5–5.5)
ALT: 36 IU/L (ref 0–44)
AST: 36 IU/L (ref 0–40)
Alkaline Phosphatase: 64 IU/L (ref 39–117)
BILIRUBIN TOTAL: 0.3 mg/dL (ref 0.0–1.2)
BILIRUBIN, DIRECT: 0.12 mg/dL (ref 0.00–0.40)
TOTAL PROTEIN: 7.4 g/dL (ref 6.0–8.5)

## 2016-11-07 ENCOUNTER — Other Ambulatory Visit: Payer: Self-pay | Admitting: *Deleted

## 2016-11-07 ENCOUNTER — Other Ambulatory Visit: Payer: Self-pay | Admitting: Cardiovascular Disease

## 2016-11-07 MED ORDER — ATORVASTATIN CALCIUM 80 MG PO TABS: ORAL_TABLET | ORAL | 3 refills | Status: DC

## 2017-01-28 ENCOUNTER — Other Ambulatory Visit: Payer: Self-pay | Admitting: Cardiovascular Disease

## 2017-02-05 ENCOUNTER — Other Ambulatory Visit: Payer: Self-pay | Admitting: Cardiovascular Disease

## 2017-03-08 ENCOUNTER — Other Ambulatory Visit: Payer: Self-pay | Admitting: Cardiovascular Disease

## 2017-03-28 ENCOUNTER — Other Ambulatory Visit: Payer: Self-pay | Admitting: Cardiovascular Disease

## 2017-07-06 ENCOUNTER — Other Ambulatory Visit: Payer: Self-pay | Admitting: Cardiovascular Disease

## 2017-09-16 ENCOUNTER — Encounter: Payer: Self-pay | Admitting: Cardiovascular Disease

## 2017-09-16 ENCOUNTER — Ambulatory Visit (INDEPENDENT_AMBULATORY_CARE_PROVIDER_SITE_OTHER): Payer: PRIVATE HEALTH INSURANCE | Admitting: Cardiovascular Disease

## 2017-09-16 VITALS — BP 140/100 | HR 76 | Ht 71.0 in | Wt 261.8 lb

## 2017-09-16 DIAGNOSIS — I2510 Atherosclerotic heart disease of native coronary artery without angina pectoris: Secondary | ICD-10-CM | POA: Diagnosis not present

## 2017-09-16 DIAGNOSIS — I10 Essential (primary) hypertension: Secondary | ICD-10-CM | POA: Diagnosis not present

## 2017-09-16 DIAGNOSIS — Z720 Tobacco use: Secondary | ICD-10-CM | POA: Diagnosis not present

## 2017-09-16 DIAGNOSIS — E785 Hyperlipidemia, unspecified: Secondary | ICD-10-CM

## 2017-09-16 NOTE — Progress Notes (Signed)
Cardiology Office Note   Date:  09/16/2017   ID:  George Welch, DOB [birth year], MRN [id number]  PCP:  Evie Lacks, NP  Cardiologist:   Lorine Bears, MD   Chief Complaint  Patient presents with  . other    12 month follow up. Meds reviewed by the pt. verbally. "doing well."       History of Present Illness: George Welch is a 41 y.o. male who presents for a follow-up visit regarding coronary artery disease. He has known history of hypertension, obesity, hyperlipidemia and marijuana use. He presented in September 2016 with non-ST elevation myocardial infarction . Cardiac catheterization showed severe two-vessel coronary artery disease involving mid left circumflex and distal and mid right coronary artery. OM 2 was also noted to be occluded with collaterals. He underwent successful angioplasty and drug-eluting stent placement to the left circumflex. He required 3 drug-eluting stents to the RCA. Echocardiogram showed normal LV systolic function with no significant valvular abnormalities. He had angioedema with lisinopril.   He has been doing well overall with no chest pain or shortness of breath. However, over the last year, he gained 20 pounds and he started smoking again. He does not exercise on a regular basis. He has been taking his medications regularly.   Past Medical History:  Diagnosis Date  . Coronary artery disease 08/2015   non-ST elevation myocardial infarction with a peak troponin of 6.4. He underwent cardiac catheterization which showed severe two-vessel coronary artery disease involving mid left circumflex and distal and mid right coronary artery. OM 2 was also noted to be occluded with collaterals. He underwent successful angioplasty and drug-eluting stent placement to the left circumflex. He required 3 drug-elu  . Drug abuse (HCC)    a. THC  . Hyperlipidemia   . Hypertension    a. poorly controlled  . MI (myocardial infarction) (HCC)   . Obesity   . Stented  coronary artery     Past Surgical History:  Procedure Laterality Date  . CARDIAC CATHETERIZATION N/A 08/26/2015   Procedure: Left Heart Cath and Coronary Angiography;  Surgeon: Antonieta Iba, MD;  Location: [facility];  Service: Cardiovascular;  Laterality: N/A;  . CARDIAC CATHETERIZATION N/A 08/26/2015   Procedure: Coronary Stent Intervention;  Surgeon: Alwyn Pea, MD;  Location: [facility];  Service: Cardiovascular;  Laterality: N/A;     Current Outpatient Prescriptions  Medication Sig Dispense Refill  . amLODipine (NORVASC) 5 MG tablet TAKE 1 TABLET (5 MG TOTAL) BY MOUTH DAILY. 30 tablet 2  . aspirin 81 MG chewable tablet Chew 1 tablet (81 mg total) by mouth daily. 30 tablet 3  . atorvastatin (LIPITOR) 80 MG tablet TAKE 1 TABLET (80 MG TOTAL) BY MOUTH DAILY AT 6 PM. 90 tablet 3  . carvedilol (COREG) 25 MG tablet TAKE 1 TABLET (25 MG TOTAL) BY MOUTH 2 (TWO) TIMES DAILY. 180 tablet 3  . clopidogrel (PLAVIX) 75 MG tablet TAKE 1 TABLET (75 MG TOTAL) BY MOUTH DAILY. 90 tablet 3  . nitroGLYCERIN (NITROSTAT) 0.4 MG SL tablet Place 1 tablet (0.4 mg total) under the tongue every 5 (five) minutes as needed for chest pain. 30 tablet 3   No current facility-administered medications for this visit.     Allergies:   Lisinopril    Social History:  The patient  reports that he has quit smoking. His smoking use included Cigarettes. He has never used smokeless tobacco. He reports that he does not drink  alcohol or use drugs.   Family History:  The patient's family history includes CAD in his father and mother; Hypertension in his father and mother; Stroke in his father.    ROS:  Please see the history of present illness.   Otherwise, review of systems are positive for none.   All other systems are reviewed and negative.    PHYSICAL EXAM: VS:  BP (!) 140/100 (BP Location: Left Arm, Patient Position: Sitting, Cuff Size: Large)   Pulse 76   Ht  (1.803 m)   Wt 261  lb 12 oz (118.7 kg)   BMI 36.51 kg/m  , BMI Body mass index is 36.51 kg/m. GEN: Well nourished, well developed, in no acute distress  HEENT: normal  Neck: no JVD, carotid bruits, or masses Cardiac: RRR; no murmurs, rubs, or gallops,no edema  Respiratory:  clear to auscultation bilaterally, normal work of breathing GI: soft, nontender, nondistended, + BS MS: no deformity or atrophy  Skin: warm and dry, no rash Neuro:  Strength and sensation are intact Psych: euthymic mood, full affect   EKG:  EKG is ordered today. The ekg ordered today demonstrates NSR. Moderate LVH.   Recent Labs: No results found for requested labs within last 8760 hours.    Lipid Panel    Component Value Date/Time   CHOL 129 09/14/2016 0759   TRIG 67 09/14/2016 0759   HDL 35 (L) 09/14/2016 0759   CHOLHDL 3.7 09/14/2016 0759   CHOLHDL 7.4 08/26/2015 0746   VLDL 57 (H) 08/26/2015 0746   LDLCALC 81 09/14/2016 0759      Wt Readings from Last 3 Encounters:  09/16/17 261 lb 12 oz (118.7 kg)  09/10/16 243 lb 12 oz (110.6 kg)  03/08/16 240 lb (108.9 kg)       ASSESSMENT AND PLAN:  1.  Coronary artery disease involving native coronary arteries without angina: He is doing very well overall. Continue medical therapy. Given multiple drug-eluting stents, I favor long-term dual antiplatelet therapy as long as tolerated.  2. Essential hypertension: Blood pressure is Mildly elevated. I suspect this is likely due to weight gain and lack of physical activity. I discussed with him the importance of lifestyle changes. I made no changes in his medications.  3. Hyperlipidemia: Continue high dose atorvastatin.  Most recent LDL was 81.  4. Obesity: I had a prolonged discussion with him about the importance of heart healthy diet, regular exercise and weight loss.  5. Tobacco use: I discussed with him again the importance of smoking cessation.  Disposition:   FU with me in  6 months  Signed,  Lorine Bears, MD    09/16/2017 2:23 PM    [facility]

## 2017-09-16 NOTE — Patient Instructions (Addendum)
Medication Instructions:  Your physician recommends that you continue on your current medications as directed. Please refer to the Current Medication list given to you today.   Labwork: none  Testing/Procedures: None.  Follow-Up: Your physician wants you to follow-up in: 6 months with Dr. Kirke Corin.  You will receive a reminder letter in the mail two months in advance. If you don't receive a letter, please call our office to schedule the follow-up appointment.   Any Other Special Instructions Will Be Listed Below (If Applicable).     If you need a refill on your cardiac medications before your next appointment, please call your pharmacy.   Coping with Quitting Smoking Quitting smoking is a physical and mental challenge. You will face cravings, withdrawal symptoms, and temptation. Before quitting, work with your health care provider to make a plan that can help you cope. Preparation can help you quit and keep you from giving in. How can I cope with cravings? Cravings usually last for 5-10 minutes. If you get through it, the craving will pass. Consider taking the following actions to help you cope with cravings:  Keep your mouth busy: ? Chew sugar-free gum. ? Suck on hard candies or a straw. ? Brush your teeth.  Keep your hands and body busy: ? Immediately change to a different activity when you feel a craving. ? Squeeze or play with a ball. ? Do an activity or a hobby, like making bead jewelry, practicing needlepoint, or working with wood. ? Mix up your normal routine. ? Take a short exercise break. Go for a quick walk or run up and down stairs. ? Spend time in public places where smoking is not allowed.  Focus on doing something kind or helpful for someone else.  Call a friend or family member to talk during a craving.  Join a support group.  Call a quit line, such as 1-800-QUIT-NOW.  Talk with your health care provider about medicines that might help you cope with  cravings and make quitting easier for you.  How can I deal with withdrawal symptoms? Your body may experience negative effects as it tries to get used to not having nicotine in the system. These effects are called withdrawal symptoms. They may include:  Feeling hungrier than normal.  Trouble concentrating.  Irritability.  Trouble sleeping.  Feeling depressed.  Restlessness and agitation.  Craving a cigarette.  To manage withdrawal symptoms:  Avoid places, people, and activities that trigger your cravings.  Remember why you want to quit.  Get plenty of sleep.  Avoid coffee and other caffeinated drinks. These may worsen some of your symptoms.  How can I handle social situations? Social situations can be difficult when you are quitting smoking, especially in the first few weeks. To manage this, you can:  Avoid parties, bars, and other social situations where people might be smoking.  Avoid alcohol.  Leave right away if you have the urge to smoke.  Explain to your family and friends that you are quitting smoking. Ask for understanding and support.  Plan activities with friends or family where smoking is not an option.  What are some ways I can cope with stress? Wanting to smoke may cause stress, and stress can make you want to smoke. Find ways to manage your stress. Relaxation techniques can help. For example:  Breathe slowly and deeply, in through your nose and out through your mouth.  Listen to soothing, relaxing music.  Talk with a family member or friend about your  stress.  Light a candle.  Soak in a bath or take a shower.  Think about a peaceful place.  What are some ways I can prevent weight gain? Be aware that many people gain weight after they quit smoking. However, not everyone does. To keep from gaining weight, have a plan in place before you quit and stick to the plan after you quit. Your plan should include:  Having healthy snacks. When you have a  craving, it may help to: ? Eat plain popcorn, crunchy carrots, celery, or other cut vegetables. ? Chew sugar-free gum.  Changing how you eat: ? Eat small portion sizes at meals. ? Eat 4-6 small meals throughout the day instead of 1-2 large meals a day. ? Be mindful when you eat. Do not watch television or do other things that might distract you as you eat.  Exercising regularly: ? Make time to exercise each day. If you do not have time for a long workout, do short bouts of exercise for 5-10 minutes several times a day. ? Do some form of strengthening exercise, like weight lifting, and some form of aerobic exercise, like running or swimming.  Drinking plenty of water or other low-calorie or no-calorie drinks. Drink 6-8 glasses of water daily, or as much as instructed by your health care provider.  Summary  Quitting smoking is a physical and mental challenge. You will face cravings, withdrawal symptoms, and temptation to smoke again. Preparation can help you as you go through these challenges.  You can cope with cravings by keeping your mouth busy (such as by chewing gum), keeping your body and hands busy, and making calls to family, friends, or a helpline for people who want to quit smoking.  You can cope with withdrawal symptoms by avoiding places where people smoke, avoiding drinks with caffeine, and getting plenty of rest.  Ask your health care provider about the different ways to prevent weight gain, avoid stress, and handle social situations. This information is not intended to replace advice given to you by your health care provider. Make sure you discuss any questions you have with your health care provider. Document Released: 11/30/2016 Document Revised: 11/30/2016 Document Reviewed: 11/30/2016 Elsevier Interactive Patient Education  2018 [employer].  Steps to Quit Smoking Smoking tobacco can be harmful to your health and can affect almost every organ in your body. Smoking puts  you, and those around you, at risk for developing many serious chronic diseases. Quitting smoking is difficult, but it is one of the best things that you can do for your health. It is never too late to quit. What are the benefits of quitting smoking? When you quit smoking, you lower your risk of developing serious diseases and conditions, such as:  Lung cancer or lung disease, such as COPD.  Heart disease.  Stroke.  Heart attack.  Infertility.  Osteoporosis and bone fractures.  Additionally, symptoms such as coughing, wheezing, and shortness of breath may get better when you quit. You may also find that you get sick less often because your body is stronger at fighting off colds and infections. If you are pregnant, quitting smoking can help to reduce your chances of having a baby of low birth weight. How do I get ready to quit? When you decide to quit smoking, create a plan to make sure that you are successful. Before you quit:  Pick a date to quit. Set a date within the next two weeks to give you time to prepare.  Write down the reasons why you are quitting. Keep this list in places where you will see it often, such as on your bathroom mirror or in your car or wallet.  Identify the people, places, things, and activities that make you want to smoke (triggers) and avoid them. Make sure to take these actions: ? Throw away all cigarettes at home, at work, and in your car. ? Throw away smoking accessories, such as Set designer. ? Clean your car and make sure to empty the ashtray. ? Clean your home, including curtains and carpets.  Tell your family, friends, and coworkers that you are quitting. Support from your loved ones can make quitting easier.  Talk with your health care provider about your options for quitting smoking.  Find out what treatment options are covered by your health insurance.  What strategies can I use to quit smoking? Talk with your healthcare provider  about different strategies to quit smoking. Some strategies include:  Quitting smoking altogether instead of gradually lessening how much you smoke over a period of time. Research shows that quitting "cold [place]" is more successful than gradually quitting.  Attending in-person counseling to help you build problem-solving skills. You are more likely to have success in quitting if you attend several counseling sessions. Even short sessions of 10 minutes can be effective.  Finding resources and support systems that can help you to quit smoking and remain smoke-free after you quit. These resources are most helpful when you use them often. They can include: ? Online chats with a Veterinary surgeon. ? Telephone quitlines. ? Automotive engineer. ? Support groups or group counseling. ? Text messaging programs. ? Mobile phone applications.  Taking medicines to help you quit smoking. (If you are pregnant or breastfeeding, talk with your health care provider first.) Some medicines contain nicotine and some do not. Both types of medicines help with cravings, but the medicines that include nicotine help to relieve withdrawal symptoms. Your health care provider may recommend: ? Nicotine patches, gum, or lozenges. ? Nicotine inhalers or sprays. ? Non-nicotine medicine that is taken by mouth.  Talk with your health care provider about combining strategies, such as taking medicines while you are also receiving in-person counseling. Using these two strategies together makes you more likely to succeed in quitting than if you used either strategy on its own. If you are pregnant or breastfeeding, talk with your health care provider about finding counseling or other support strategies to quit smoking. Do not take medicine to help you quit smoking unless told to do so by your health care provider. What things can I do to make it easier to quit? Quitting smoking might feel overwhelming at first, but there is a lot  that you can do to make it easier. Take these important actions:  Reach out to your family and friends and ask that they support and encourage you during this time. Call telephone quitlines, reach out to support groups, or work with a counselor for support.  Ask people who smoke to avoid smoking around you.  Avoid places that trigger you to smoke, such as bars, parties, or smoke-break areas at work.  Spend time around people who do not smoke.  Lessen stress in your life, because stress can be a smoking trigger for some people. To lessen stress, try: ? Exercising regularly. ? Deep-breathing exercises. ? Yoga. ? Meditating. ? Performing a body scan. This involves closing your eyes, scanning your body from head to toe, and noticing which  parts of your body are particularly tense. Purposefully relax the muscles in those areas.  Download or purchase mobile phone or tablet apps (applications) that can help you stick to your quit plan by providing reminders, tips, and encouragement. There are many free apps, such as QuitGuide from the [employer] developer for Disease Control and Prevention). You can find other support for quitting smoking (smoking cessation) through smokefree.gov and other websites.  How will I feel when I quit smoking? Within the first 24 hours of quitting smoking, you may start to feel some withdrawal symptoms. These symptoms are usually most noticeable 2-3 days after quitting, but they usually do not last beyond 2-3 weeks. Changes or symptoms that you might experience include:  Mood swings.  Restlessness, anxiety, or irritation.  Difficulty concentrating.  Dizziness.  Strong cravings for sugary foods in addition to nicotine.  Mild weight gain.  Constipation.  Nausea.  Coughing or a sore throat.  Changes in how your medicines work in your body.  A depressed mood.  Difficulty sleeping (insomnia).  After the first 2-3 weeks of quitting, you may start to notice more  positive results, such as:  Improved sense of smell and taste.  Decreased coughing and sore throat.  Slower heart rate.  Lower blood pressure.  Clearer skin.  The ability to breathe more easily.  Fewer sick days.  Quitting smoking is very challenging for most people. Do not get discouraged if you are not successful the first time. Some people need to make many attempts to quit before they achieve long-term success. Do your best to stick to your quit plan, and talk with your health care provider if you have any questions or concerns. This information is not intended to replace advice given to you by your health care provider. Make sure you discuss any questions you have with your health care provider. Document Released: 11/27/2001 Document Revised: 07/31/2016 Document Reviewed: 04/19/2015 Elsevier Interactive Patient Education  2017 [employer].  Smoking Tobacco Information Smoking tobacco will very likely harm your health. Tobacco contains a poisonous (toxic), colorless chemical called nicotine. Nicotine affects the brain and makes tobacco addictive. This change in your brain can make it hard to stop smoking. Tobacco also has other toxic chemicals that can hurt your body and raise your risk of many cancers. How can smoking tobacco affect me? Smoking tobacco can increase your chances of having serious health conditions, such as:  Cancer. Smoking is most commonly associated with lung cancer, but can lead to cancer in other parts of the body.  Chronic obstructive pulmonary disease (COPD). This is a long-term lung condition that makes it hard to breathe. It also gets worse over time.  High blood pressure (hypertension), heart disease, stroke, or heart attack.  Lung infections, such as pneumonia.  Cataracts. This is when the lenses in the eyes become clouded.  Digestive problems. This may include peptic ulcers, heartburn, and gastroesophageal reflux disease (GERD).  Oral health  problems, such as gum disease and tooth loss.  Loss of taste and smell.  Smoking can affect your appearance by causing:  Wrinkles.  Yellow or stained teeth, fingers, and fingernails.  Smoking tobacco can also affect your social life.  Many workplaces, [employer], hotels, and public places are tobacco-free. This means that you may experience challenges in finding places to smoke when away from home.  The cost of a smoking habit can be expensive. Expenses for someone who smokes come in two ways: ? You spend money on a regular basis  to buy tobacco. ? Your health care costs in the long-term are higher if you smoke.  Tobacco smoke can also affect the health of those around you. Children of smokers have greater chances of: ? Sudden infant death syndrome (SIDS). ? Ear infections. ? Lung infections.  What lifestyle changes can be made?  Do not start smoking. Quit if you already do.  To quit smoking: ? Make a plan to quit smoking and commit yourself to it. Look for programs to help you and ask your health care provider for recommendations and ideas. ? Talk with your health care provider about using nicotine replacement medicines to help you quit. Medicine replacement medicines include gum, lozenges, patches, sprays, or pills. ? Do not replace cigarette smoking with electronic cigarettes, which are commonly called e-cigarettes. The safety of e-cigarettes is not known, and some may contain harmful chemicals. ? Avoid places, people, or situations that tempt you to smoke. ? If you try to quit but return to smoking, don't give up hope. It is very common for people to try a number of times before they fully succeed. When you feel ready again, give it another try.  Quitting smoking might affect the way you eat as well as your weight. Be prepared to monitor your eating habits. Get support in planning and following a healthy diet.  Ask your health care provider about having regular tests  (screenings) to check for cancer. This may include blood tests, imaging tests, and other tests.  Exercise regularly. Consider taking walks, joining a gym, or doing yoga or exercise classes.  Develop skills to manage your stress. These skills include meditation. What are the benefits of quitting smoking? By quitting smoking, you may:  Lower your risk of getting cancer and other diseases caused by smoking.  Live longer.  Breathe better.  Lower your blood pressure and heart rate.  Stop your addiction to tobacco.  Stop creating secondhand smoke that hurts other people.  Improve your sense of taste and smell.  Look better over time, due to having fewer wrinkles and less staining.  What can happen if changes are not made? If you do not stop smoking, you may:  Get cancer and other diseases.  Develop COPD or other long-term (chronic) lung conditions.  Develop serious problems with your heart and blood vessels (cardiovascular system).  Need more tests to screen for problems caused by smoking.  Have higher, long-term healthcare costs from medicines or treatments related to smoking.  Continue to have worsening changes in your lungs, mouth, and nose.  Where to find support: To get support to quit smoking, consider:  Asking your health care provider for more information and resources.  Taking classes to learn more about quitting smoking.  Looking for local organizations that offer resources about quitting smoking.  Joining a support group for people who want to quit smoking in your local community.  Where to find more information: You may find more information about quitting smoking from:  HelpGuide.org: www.helpguide.org/articles/addictions/how-to-quit-smoking.htm  BankRights.uy: smokefree.gov  American Lung Association: www.lung.org  Contact a health care provider if:  You have problems breathing.  Your lips, nose, or fingers turn blue.  You have chest  pain.  You are coughing up blood.  You feel faint or you pass out.  You have other noticeable changes that cause you to worry. Summary  Smoking tobacco can negatively affect your health, the health of those around you, your finances, and your social life.  Do not start smoking. Quit if  you already do. If you need help quitting, ask your health care provider.  Think about joining a support group for people who want to quit smoking in your local community. There are many effective programs that will help you to quit this behavior. This information is not intended to replace advice given to you by your health care provider. Make sure you discuss any questions you have with your health care provider. Document Released: 12/18/2016 Document Revised: 12/18/2016 Document Reviewed: 12/18/2016 Elsevier Interactive Patient Education  2018 [employer].  Heart-Healthy Eating Plan Many factors influence your heart health, including eating and exercise habits. Heart (coronary) risk increases with abnormal blood fat (lipid) levels. Heart-healthy meal planning includes limiting unhealthy fats, increasing healthy fats, and making other small dietary changes. This includes maintaining a healthy body weight to help keep lipid levels within a normal range. What is my plan? Your health care provider recommends that you:   Limit your intake of saturated fat   Limit the amount of cholesterol in your diet   What types of fat should I choose?  Choose healthy fats more often. Choose monounsaturated and polyunsaturated fats, such as olive oil and canola oil, flaxseeds, walnuts, almonds, and seeds.  Eat more omega-3 fats. Good choices include salmon, mackerel, sardines, tuna, flaxseed oil, and ground flaxseeds. Aim to eat fish at least two times each week.  Limit saturated fats. Saturated fats are primarily found in animal products, such as meats, butter, and cream. Plant sources of saturated fats include  palm oil, palm kernel oil, and coconut oil.  Avoid foods with partially hydrogenated oils in them. These contain trans fats. Examples of foods that contain trans fats are stick margarine, some tub margarines, cookies, crackers, and other baked goods. What general guidelines do I need to follow?  Check food labels carefully to identify foods with trans fats or high amounts of saturated fat.  Fill one half of your plate with vegetables and green salads. Eat 4-5 servings of vegetables per day. A serving of vegetables equals 1 cup of raw leafy vegetables,  cup of raw or cooked cut-up vegetables, or  cup of vegetable juice.  Fill one fourth of your plate with whole grains. Look for the word "whole" as the first word in the ingredient list.  Fill one fourth of your plate with lean protein foods.  Eat 4-5 servings of fruit per day. A serving of fruit equals one medium whole fruit,  cup of dried fruit,  cup of fresh, frozen, or canned fruit, or  cup of 100% fruit juice.  Eat more foods that contain soluble fiber. Examples of foods that contain this type of fiber are apples, broccoli, carrots, beans, peas, and barley. Aim to get 20-30 g of fiber per day.  Eat more home-cooked food and less restaurant, buffet, and fast food.  Limit or avoid alcohol.  Limit foods that are high in starch and sugar.  Avoid fried foods.  Cook foods by using methods other than frying. Baking, boiling, grilling, and broiling are all great options. Other fat-reducing suggestions include: ? Removing the skin from poultry. ? Removing all visible fats from meats. ? Skimming the fat off of stews, soups, and gravies before serving them. ? Steaming vegetables in water or broth.  Lose weight if you are overweight. Losing just 5-10% of your initial body weight can help your overall health and prevent diseases such as diabetes and heart disease.  Increase your consumption of nuts, legumes, and seeds to 4-5  servings per  week. One serving of dried beans or legumes equals  cup after being cooked, one serving of nuts equals 1 ounces, and one serving of seeds equals  ounce or 1 tablespoon.  You may need to monitor your salt (sodium) intake, especially if you have high blood pressure. Talk with your health care provider or dietitian to get more information about reducing sodium. What foods can I eat? Grains  Breads, including [place], white, pita, wheat, raisin, rye, oatmeal, and [place]. Tortillas that are neither fried nor made with lard or trans fat. Low-fat rolls, including hotdog and hamburger buns and English muffins. Biscuits. Muffins. Waffles. Pancakes. Light popcorn. Whole-grain cereals. Flatbread. Melba toast. Pretzels. Breadsticks. Rusks. Low-fat snacks and crackers, including oyster, saltine, matzo, graham, animal, and rye. Rice and pasta, including brown rice and those that are made with whole wheat. Vegetables All vegetables. Fruits All fruits, but limit coconut. Meats and Other Protein Sources Lean, well-trimmed beef, veal, pork, and lamb. Chicken and [place] without skin. All fish and shellfish. Wild duck, rabbit, pheasant, and venison. Egg whites or low-cholesterol egg substitutes. Dried beans, peas, lentils, and tofu.Seeds and most nuts. Dairy Low-fat or nonfat cheeses, including ricotta, string, and mozzarella. Skim or 1% milk that is liquid, powdered, or evaporated. Buttermilk that is made with low-fat milk. Nonfat or low-fat yogurt. Beverages Mineral water. Diet carbonated beverages. Sweets and Desserts Sherbets and fruit ices. Honey, jam, marmalade, jelly, and syrups. Meringues and gelatins. Pure sugar candy, such as hard candy, jelly beans, gumdrops, mints, marshmallows, and small amounts of dark chocolate. [employer]. Eat all sweets and desserts in moderation. Fats and Oils Nonhydrogenated (trans-free) margarines. Vegetable oils, including soybean, sesame, sunflower, olive, peanut,  safflower, corn, canola, and cottonseed. Salad dressings or mayonnaise that are made with a vegetable oil. Limit added fats and oils that you use for cooking, baking, salads, and as spreads. Other Cocoa powder. Coffee and tea. All seasonings and condiments. The items listed above may not be a complete list of recommended foods or beverages. Contact your dietitian for more options. What foods are not recommended? Grains Breads that are made with saturated or trans fats, oils, or whole milk. Croissants. Butter rolls. Cheese breads. Sweet rolls. Donuts. Buttered popcorn. Chow mein noodles. High-fat crackers, such as cheese or butter crackers. Meats and Other Protein Sources Fatty meats, such as hotdogs, short ribs, sausage, spareribs, bacon, ribeye roast or steak, and mutton. High-fat deli meats, such as salami and bologna. Caviar. Domestic duck and goose. Organ meats, such as kidney, liver, sweetbreads, brains, gizzard, chitterlings, and heart. Dairy Cream, sour cream, cream cheese, and creamed cottage cheese. Whole milk cheeses, including blue (bleu), [address], [place], [place], [address], [place], [address], [place], [place], and [place]. Whole or 2% milk that is liquid, evaporated, or condensed. Whole buttermilk. Cream sauce or high-fat cheese sauce. Yogurt that is made from whole milk. Beverages Regular sodas and drinks with added sugar. Sweets and Desserts Frosting. Pudding. Cookies. Cakes other than angel food cake. Candy that has milk chocolate or white chocolate, hydrogenated fat, butter, coconut, or unknown ingredients. Buttered syrups. Full-fat ice cream or ice cream drinks. Fats and Oils Gravy that has suet, meat fat, or shortening. Cocoa butter, hydrogenated oils, palm oil, coconut oil, palm kernel oil. These can often be found in baked products, candy, fried foods, nondairy creamers, and whipped toppings. Solid fats and shortenings, including bacon fat, salt pork, lard, and butter. Nondairy  cream substitutes, such as coffee creamers and sour cream substitutes.  Salad dressings that are made of unknown oils, cheese, or sour cream. The items listed above may not be a complete list of foods and beverages to avoid. Contact your dietitian for more information. This information is not intended to replace advice given to you by your health care provider. Make sure you discuss any questions you have with your health care provider. Document Released: 09/11/2008 Document Revised: 06/22/2016 Document Reviewed: 05/27/2014 Elsevier Interactive Patient Education  2017 [employer] Fat and Cholesterol Restricted Diet High levels of fat and cholesterol in your blood may lead to various health problems, such as diseases of the heart, blood vessels, gallbladder, liver, and pancreas. Fats are concentrated sources of energy that come in various forms. Certain types of fat, including saturated fat, may be harmful in excess. Cholesterol is a substance needed by your body in small amounts. Your body makes all the cholesterol it needs. Excess cholesterol comes from the food you eat. When you have high levels of cholesterol and saturated fat in your blood, health problems can develop because the excess fat and cholesterol will gather along the walls of your blood vessels, causing them to narrow. Choosing the right foods will help you control your intake of fat and cholesterol. This will help keep the levels of these substances in your blood within normal limits and reduce your risk of disease. What is my plan? Your health care provider recommends that you:  Limit your fat intake   Limit the amount of cholesterol in your diet   Eat 20-30 grams of fiber each day.  What types of fat should I choose?  Choose healthy fats more often. Choose monounsaturated and polyunsaturated fats, such as olive and canola oil, flaxseeds, walnuts, almonds, and seeds.  Eat more omega-3 fats. Good choices include salmon,  mackerel, sardines, tuna, flaxseed oil, and ground flaxseeds. Aim to eat fish at least two times a week.  Limit saturated fats. Saturated fats are primarily found in animal products, such as meats, butter, and cream. Plant sources of saturated fats include palm oil, palm kernel oil, and coconut oil.  Avoid foods with partially hydrogenated oils in them. These contain trans fats. Examples of foods that contain trans fats are stick margarine, some tub margarines, cookies, crackers, and other baked goods. What general guidelines do I need to follow? These guidelines for healthy eating will help you control your intake of fat and cholesterol:  Check food labels carefully to identify foods with trans fats or high amounts of saturated fat.  Fill one half of your plate with vegetables and green salads.  Fill one fourth of your plate with whole grains. Look for the word "whole" as the first word in the ingredient list.  Fill one fourth of your plate with lean protein foods.  Limit fruit to two servings a day. Choose fruit instead of juice.  Eat more foods that contain fiber, such as apples, broccoli, carrots, beans, peas, and barley.  Eat more home-cooked food and less restaurant, buffet, and fast food.  Limit or avoid alcohol.  Limit foods high in starch and sugar.  Limit fried foods.  Cook foods using methods other than frying. Baking, boiling, grilling, and broiling are all great options.  Lose weight if you are overweight. Losing just 5-10% of your initial body weight can help your overall health and prevent diseases such as diabetes and heart disease.  What foods can I eat? Grains  Whole grains, such as whole wheat or whole grain breads, crackers, cereals,  and pasta. Unsweetened oatmeal, bulgur, barley, quinoa, or brown rice. Corn or whole wheat flour tortillas. Vegetables  Fresh or frozen vegetables (raw, steamed, roasted, or grilled). Green salads. Fruits  All fresh, canned  (in natural juice), or frozen fruits. Meats and other protein foods  Ground beef (85% or leaner), grass-fed beef, or beef trimmed of fat. Skinless chicken or [place]. Ground chicken or [place]. Pork trimmed of fat. All fish and seafood. Eggs. Dried beans, peas, or lentils. Unsalted nuts or seeds. Unsalted canned or dry beans. Dairy  Low-fat dairy products, such as skim or 1% milk, 2% or reduced-fat cheeses, low-fat ricotta or cottage cheese, or plain low-fat yo Fats and oils  Tub margarines without trans fats. Light or reduced-fat mayonnaise and salad dressings. Avocado. Olive, canola, sesame, or safflower oils. Natural peanut or almond butter (choose ones without added sugar and oil). The items listed above may not be a complete list of recommended foods or beverages. Contact your dietitian for more options. Foods to avoid Grains  White bread. White pasta. White rice. Cornbread. Bagels, pastries, and croissants. Crackers that contain trans fat. Vegetables  White potatoes. Corn. Creamed or fried vegetables. Vegetables in a cheese sauce. Fruits  Dried fruits. Canned fruit in light or heavy syrup. Fruit juice. Meats and other protein foods  Fatty cuts of meat. Ribs, chicken wings, bacon, sausage, bologna, salami, chitterlings, fatback, hot dogs, bratwurst, and packaged luncheon meats. Liver and organ meats. Dairy  Whole or 2% milk, cream, half-and-half, and cream cheese. Whole milk cheeses. Whole-fat or sweetened yogurt. Full-fat cheeses. Nondairy creamers and whipped toppings. Processed cheese, cheese spreads, or cheese curds. Beverages  Alcohol. Sweetened drinks (such as sodas, lemonade, and fruit drinks or punches). Fats and oils  Butter, stick margarine, lard, shortening, ghee, or bacon fat. Coconut, palm kernel, or palm oils. Sweets and desserts  Corn syrup, sugars, honey, and molasses. Candy. Jam and jelly. Syrup. Sweetened cereals. Cookies, pies, cakes, donuts, muffins, and ice  cream. The items listed above may not be a complete list of foods and beverages to avoid. Contact your dietitian for more information. This information is not intended to replace advice given to you by your health care provider. Make sure you discuss any questions you have with your health care provider. Document Released: 12/03/2005 Document Revised: 12/24/2014 Document Reviewed: 03/03/2014 Elsevier Interactive Patient Education  2017 [employer].

## 2017-10-06 ENCOUNTER — Other Ambulatory Visit: Payer: Self-pay | Admitting: Cardiovascular Disease

## 2017-11-10 ENCOUNTER — Other Ambulatory Visit: Payer: Self-pay | Admitting: Cardiovascular Disease

## 2018-02-19 ENCOUNTER — Other Ambulatory Visit: Payer: Self-pay | Admitting: Cardiovascular Disease

## 2018-03-22 ENCOUNTER — Other Ambulatory Visit: Payer: Self-pay | Admitting: Cardiovascular Disease

## 2018-03-24 NOTE — Telephone Encounter (Signed)
Patient needs to contact office for a past due follow up before anymore refills given.

## 2018-04-18 ENCOUNTER — Other Ambulatory Visit: Payer: Self-pay | Admitting: Cardiovascular Disease

## 2018-04-23 ENCOUNTER — Encounter: Payer: Self-pay | Admitting: Nurse Practitioner

## 2018-04-23 ENCOUNTER — Ambulatory Visit (INDEPENDENT_AMBULATORY_CARE_PROVIDER_SITE_OTHER): Payer: PRIVATE HEALTH INSURANCE | Admitting: Nurse Practitioner

## 2018-04-23 VITALS — BP 148/96 | HR 75 | Ht 71.0 in | Wt 262.0 lb

## 2018-04-23 DIAGNOSIS — I2510 Atherosclerotic heart disease of native coronary artery without angina pectoris: Secondary | ICD-10-CM

## 2018-04-23 DIAGNOSIS — I10 Essential (primary) hypertension: Secondary | ICD-10-CM | POA: Diagnosis not present

## 2018-04-23 DIAGNOSIS — E785 Hyperlipidemia, unspecified: Secondary | ICD-10-CM | POA: Diagnosis not present

## 2018-04-23 DIAGNOSIS — Z720 Tobacco use: Secondary | ICD-10-CM

## 2018-04-23 MED ORDER — AMLODIPINE BESYLATE 10 MG PO TABS: 10.0000 mg | ORAL_TABLET | Freq: Every day | ORAL | 11 refills | Status: DC

## 2018-04-23 NOTE — Progress Notes (Signed)
Office Visit    Patient Name: George Welch Date of Encounter: 04/23/2018  Primary Care Provider:  Evie Lacks, NP Primary Cardiologist:  Lorine Bears, MD  Chief Complaint    42 y/o ? with a h/o CAD, diast dysfxn, HTN, HL, and tob/marijuana abuse, who presents for follow-up.  Past Medical History    Past Medical History:  Diagnosis Date  . Coronary artery disease 08/2015   a. 08/2015 NSTEMI/PCI: Sev mLCX (DES x 3) & m/d RCA dzs. OM 2 100 w. collats.   . Diastolic dysfunction    a. 08/2015 Echo: EF 55-60%, Gr1 DD.   [name] abuse (HCC)    a. THC  . Hyperlipidemia   . Hypertension    a. poorly controlled  . MI (myocardial infarction) (HCC)   . Obesity   . Stented coronary artery    Past Surgical History:  Procedure Laterality Date  . CARDIAC CATHETERIZATION N/A 08/26/2015   Procedure: Left Heart Cath and Coronary Angiography;  Surgeon: Antonieta Iba, MD;  Location: [facility] INVASIVE CV LAB;  Service: Cardiovascular;  Laterality: N/A;  . CARDIAC CATHETERIZATION N/A 08/26/2015   Procedure: Coronary Stent Intervention;  Surgeon: Alwyn Pea, MD;  Location: [facility] INVASIVE CV LAB;  Service: Cardiovascular;  Laterality: N/A;    Allergies  Allergies  Allergen Reactions  . Lisinopril     Angioedema    History of Present Illness    42 y/o ? with a h/o CAD s/p NSTEMI in 08/2015 with PCI/DES to the LCX and RCA, HTN, HL, Diast dysfxn, tob/mj abuse, and obesity.  He was alst seen in clinic in 09/2017, at which time he was doing reasonably well.  Since then, he continues to do well and denies chest pain or dyspnea.  He does not routinely exercise but does work full time w/o symptoms or limitations.  He continues to smoke and says that he knows he needs to stop.  No pnd, orthopnea, n, v, dizziness, syncope, palpitations, edema, or early satiety.  He has been compliant w/ meds and checks his bp from time to time - notes that it is typically in the 140's and above.  Home  Medications    Prior to Admission medications   Medication Sig Start Date End Date Taking? Authorizing Provider  amLODipine (NORVASC) 5 MG tablet TAKE 1 TABLET (5 MG TOTAL) BY MOUTH DAILY. 04/18/18  Yes Iran Ouch, MD  aspirin 81 MG chewable tablet Chew 1 tablet (81 mg total) by mouth daily. 01/26/16  Yes Iran Ouch, MD  atorvastatin (LIPITOR) 80 MG tablet TAKE 1 TABLET (80 MG TOTAL) BY MOUTH DAILY AT 6 PM. 11/11/17  Yes Iran Ouch, MD  carvedilol (COREG) 25 MG tablet TAKE 1 TABLET (25 MG TOTAL) BY MOUTH 2 (TWO) TIMES DAILY. 02/19/18  Yes Iran Ouch, MD  clopidogrel (PLAVIX) 75 MG tablet TAKE 1 TABLET (75 MG TOTAL) BY MOUTH DAILY. 03/24/18  Yes Iran Ouch, MD  nitroGLYCERIN (NITROSTAT) 0.4 MG SL tablet Place 1 tablet (0.4 mg total) under the tongue every 5 (five) minutes as needed for chest pain. 08/28/15  Yes Enid Baas, MD    Review of Systems    He denies chest pain, palpitations, dyspnea, pnd, orthopnea, n, v, dizziness, syncope, edema, weight gain, or early satiety.  All other systems reviewed and are otherwise negative except as noted above.  Physical Exam    VS:  BP (!) 148/96 (BP Location: Left Arm, Patient Position: Sitting, Cuff  Size: Large)   Pulse 75   Ht  (1.803 m)   Wt 262 lb (118.8 kg)   BMI 36.54 kg/m  , BMI Body mass index is 36.54 kg/m. GEN: Well nourished, well developed, in no acute distress.  HEENT: normal.  Neck: Supple, obese, difficult to gauge JVP, carotid bruits, or masses. Cardiac: RRR, no murmurs, rubs, or gallops. No clubbing, cyanosis, edema.  Radials/DP/PT 2+ and equal bilaterally.  Respiratory:  Respirations regular and unlabored, clear to auscultation bilaterally. GI: Soft, nontender, nondistended, BS + x 4. MS: no deformity or atrophy. Skin: warm and dry, no rash. Neuro:  Strength and sensation are intact. Psych: Normal affect.  Accessory Clinical Findings    ECG - RSR, 75, LAD, inf infarct, no acute st/t  changes.  Assessment & Plan    1.  CAD:  S/p prior PCI to the RCA (DES x 3) and LCX (DES x 1) in setting of non-STEMI in 2016.  He has been doing well without chest pain or dyspnea.  He remains on aspirin, statin, beta-blocker, and Plavix therapy.  As per previous plan, continue dual antiplatelet therapy indefinitely.  2.  Essential HTN: Blood pressure elevated today at 148/96.  He says it is often this high.  In that setting, I am increasing amlodipine to 10 mg daily.  He does have a blood pressure cuff at home and I have asked him to record numbers daily and call us in approximately 1 week with values.  I suspect we may need to add chlorthalidone next.  3.  HL: Patient has not had lipids or LFTs in several years.  He is not fasting today.  I have ordered fasting lipids and LFTs which he can have done within the next week or so.  4.  Tob Abuse:  Cessation advised.  Not currently contemplating quitting.  5.  Dispo: Follow-up fasting lipids and LFTs.  He will contact us with blood pressures.  Follow-up in clinic in 6 months or sooner if necessary.   Nicolasa Ducking, NP 04/23/2018, 3:16 PM

## 2018-04-23 NOTE — Patient Instructions (Signed)
Medication Instructions: - Your physician has recommended you make the following change in your medication:   1) INCREASE norvasc (amlodipine) to 10 mg- take 1 tablet by mouth once daily  Labwork: - Your physician recommends that you return FASTING for lab work at your convenience: Lipid/ Liver panel  (Orders have been placed for you to have this done at the hospital lab- [facility]- 1st desk on the right to check in. This is done on a walk in basis.)  Procedures/Testing: - none ordered  Follow-Up: - Your physician wants you to follow-up in: 6 months with Dr. Kirke Corin. You will receive a reminder letter in the mail two months in advance. If you don't receive a letter, please call our office to schedule the follow-up appointment.   Any Additional Special Instructions Will Be Listed Below (If Applicable).     If you need a refill on your cardiac medications before your next appointment, please call your pharmacy.

## 2018-07-02 ENCOUNTER — Other Ambulatory Visit: Payer: Self-pay | Admitting: Cardiovascular Disease

## 2018-10-12 ENCOUNTER — Other Ambulatory Visit: Payer: Self-pay | Admitting: Cardiovascular Disease

## 2019-01-13 ENCOUNTER — Other Ambulatory Visit: Payer: Self-pay | Admitting: Cardiovascular Disease

## 2019-02-18 ENCOUNTER — Other Ambulatory Visit: Payer: Self-pay | Admitting: Cardiovascular Disease

## 2019-02-20 ENCOUNTER — Telehealth: Payer: Self-pay | Admitting: Cardiovascular Disease

## 2019-02-20 NOTE — Telephone Encounter (Signed)
-----   Message from Andi Devon sent at 02/18/2019  3:57 PM EST ----- Regarding: needs appointment Please schedule appointment for patient. Past due for 6 month F/U.  Thank you!

## 2019-02-20 NOTE — Telephone Encounter (Signed)
LMOV to schedule follow up with Dr Kirke Corin

## 2019-02-24 NOTE — Telephone Encounter (Signed)
lmov to schedule appt.  Patient also overdue for lipid liver labs

## 2019-03-21 ENCOUNTER — Other Ambulatory Visit: Payer: Self-pay | Admitting: Cardiovascular Disease

## 2019-03-23 ENCOUNTER — Telehealth: Payer: Self-pay | Admitting: Cardiovascular Disease

## 2019-03-23 NOTE — Telephone Encounter (Signed)
Email Consent for Evisit Pending

## 2019-03-30 NOTE — Telephone Encounter (Signed)
Telephone Consent Obtained Verbally.  YOUR CARDIOLOGY TEAM HAS ARRANGED FOR AN E-VISIT FOR YOUR APPOINTMENT - PLEASE REVIEW IMPORTANT INFORMATION BELOW SEVERAL DAYS PRIOR TO YOUR APPOINTMENT  Due to the recent COVID-19 pandemic, we are transitioning in-person office visits to tele-medicine visits in an effort to decrease unnecessary exposure to our patients and staff. Medicare and most insurances are covering these visits without a copay needed. We also encourage you to sign up for MyChart if you have not already done so. You will need a smartphone if possible. For patients that do not have this, we can still complete the visit using a regular telephone but do prefer a smartphone to enable video when possible. You may have a close family member that lives with you that can help. If possible, we also ask that you have a blood pressure cuff and scale at home to measure your blood pressure, heart rate and weight prior to your scheduled appointment. Patients with clinical needs that need an in-person evaluation and testing will still be able to come to the office if absolutely necessary. If you have any questions, feel free to call our office.    IF YOU HAVE A SMARTPHONE, PLEASE DOWNLOAD THE WEBEX APP TO YOUR SMARTPHONE  - If Apple, go to [employer] and type in WebEx in the search bar. Download [employer], the blue/green circle. The app is free but as with any other app download, your phone may require you to verify saved payment information or Apple password. You do NOT have to create a WebEx account.  - If Android, go to [employer] and type in [employer] in the search bar. Download [employer], the blue/green circle. The app is free but as with any other app download, your phone may require you to verify saved payment information or Android password. You do NOT have to create a WebEx account.  It is very helpful to have this downloaded before your visit.    2-3 DAYS BEFORE YOUR  APPOINTMENT  You will receive a telephone call from one of our [facility] team members - your caller ID may say "Unknown caller." If this is a video visit, we will confirm that you have been able to download the WebEx app. We will remind you check your blood pressure, heart rate and weight prior to your scheduled appointment. If you have an Apple Watch or Kardia, please upload any pertinent ECG strips the day before or morning of your appointment to MyChart. Our staff will also make sure you have reviewed the consent and agree to move forward with your scheduled tele-health visit.     THE DAY OF YOUR APPOINTMENT  Approximately 15 minutes prior to your scheduled appointment, you will receive a telephone call from one of [facility] team - your caller ID may say "Unknown caller."  Our staff will confirm medications, vital signs for the day and any symptoms you may be experiencing. Please have this information available prior to the time of visit start. It may also be helpful for you to have a pad of paper and pen handy for any instructions given during your visit. They will also walk you through joining the WebEx smartphone meeting if this is a video visit.    CONSENT FOR TELE-HEALTH VISIT - PLEASE REVIEW  I hereby voluntarily request, consent and authorize [facility] and its employed or contracted physicians, physician assistants, nurse practitioners or other licensed health care professionals (the Practitioner), to provide me with telemedicine health  care services (the "Services") as deemed necessary by the treating Practitioner. I acknowledge and consent to receive the Services by the Practitioner via telemedicine. I understand that the telemedicine visit will involve communicating with the Practitioner through live audiovisual communication technology and the disclosure of certain medical information by electronic transmission. I acknowledge that I have been given the opportunity to request an  in-person assessment or other available alternative prior to the telemedicine visit and am voluntarily participating in the telemedicine visit.  I understand that I have the right to withhold or withdraw my consent to the use of telemedicine in the course of my care at any time, without affecting my right to future care or treatment, and that the Practitioner or I may terminate the telemedicine visit at any time. I understand that I have the right to inspect all information obtained and/or recorded in the course of the telemedicine visit and may receive copies of available information for a reasonable fee.  I understand that some of the potential risks of receiving the Services via telemedicine include:  Marland Kitchen Delay or interruption in medical evaluation due to technological equipment failure or disruption; . Information transmitted may not be sufficient (e.g. poor resolution of images) to allow for appropriate medical decision making by the Practitioner; and/or  . In rare instances, security protocols could fail, causing a breach of personal health information.  Furthermore, I acknowledge that it is my responsibility to provide information about my medical history, conditions and care that is complete and accurate to the best of my ability. I acknowledge that Practitioner's advice, recommendations, and/or decision may be based on factors not within their control, such as incomplete or inaccurate data provided by me or distortions of diagnostic images or specimens that may result from electronic transmissions. I understand that the practice of medicine is not an exact science and that Practitioner makes no warranties or guarantees regarding treatment outcomes. I acknowledge that I will receive a copy of this consent concurrently upon execution via email to the email address I last provided but may also request a printed copy by calling the office of [facility].    I understand that my insurance will be  billed for this visit.   I have read or had this consent read to me. . I understand the contents of this consent, which adequately explains the benefits and risks of the Services being provided via telemedicine.  . I have been provided ample opportunity to ask questions regarding this consent and the Services and have had my questions answered to my satisfaction. . I give my informed consent for the services to be provided through the use of telemedicine in my medical care  By participating in this telemedicine visit I agree to the above. Yes

## 2019-03-31 ENCOUNTER — Other Ambulatory Visit: Payer: Self-pay | Admitting: Cardiovascular Disease

## 2019-04-07 ENCOUNTER — Telehealth (INDEPENDENT_AMBULATORY_CARE_PROVIDER_SITE_OTHER): Payer: BC Managed Care – PPO | Admitting: Cardiovascular Disease

## 2019-04-07 ENCOUNTER — Other Ambulatory Visit: Payer: Self-pay | Admitting: *Deleted

## 2019-04-07 ENCOUNTER — Other Ambulatory Visit: Payer: Self-pay

## 2019-04-07 ENCOUNTER — Encounter: Payer: Self-pay | Admitting: Cardiovascular Disease

## 2019-04-07 VITALS — BP 168/108 | HR 74 | Ht 71.0 in | Wt 263.0 lb

## 2019-04-07 DIAGNOSIS — I10 Essential (primary) hypertension: Secondary | ICD-10-CM | POA: Diagnosis not present

## 2019-04-07 DIAGNOSIS — I2510 Atherosclerotic heart disease of native coronary artery without angina pectoris: Secondary | ICD-10-CM

## 2019-04-07 DIAGNOSIS — E785 Hyperlipidemia, unspecified: Secondary | ICD-10-CM

## 2019-04-07 MED ORDER — NITROGLYCERIN 0.4 MG SL SUBL: 0.4000 mg | SUBLINGUAL_TABLET | SUBLINGUAL | 0 refills | Status: DC | PRN

## 2019-04-07 MED ORDER — HYDROCHLOROTHIAZIDE 25 MG PO TABS: 25.0000 mg | ORAL_TABLET | Freq: Every day | ORAL | 3 refills | Status: DC

## 2019-04-07 NOTE — Progress Notes (Signed)
Virtual Visit via Video Note   This visit type was conducted due to national recommendations for restrictions regarding the COVID-19 Pandemic (e.g. social distancing) in an effort to limit this patient's exposure and mitigate transmission in our community.  Due to his co-morbid illnesses, this patient is at least at moderate risk for complications without adequate follow up.  This format is felt to be most appropriate for this patient at this time.  All issues noted in this document were discussed and addressed.  A limited physical exam was performed with this format.  Please refer to the patient's chart for his consent to telehealth for [facility].   Evaluation Performed:  Follow-up visit  Date:  04/07/2019   ID:  George Welch, DOB 01/19/1976, MRN [id number]  Patient Location: Home Provider Location: Office  PCP:  Evie LacksPointer, Elizabeth, NP  Cardiologist:  Lorine BearsMuhammad Nimsi Males, MD  Electrophysiologist:  None   Chief Complaint: No symptoms today.  Blood pressure is elevated.  History of Present Illness:    George Welch is a 43 y.o. male who was seen via video conference today for follow-up visit regarding coronary artery disease and hypertension.  He has known history of hypertension, obesity, hyperlipidemia and marijuana use. He presented in September 2016 with non-ST elevation myocardial infarction . Cardiac catheterization showed severe two-vessel coronary artery disease involving mid left circumflex, distal and mid right coronary artery. OM 2 was also noted to be occluded with collaterals. He underwent successful angioplasty and drug-eluting stent placement to the left circumflex. He required 3 drug-eluting stents to the RCA. Echocardiogram showed normal LV systolic function with no significant valvular abnormalities. He had angioedema with lisinopril.   He has been doing well with no recent chest pain, shortness of breath or palpitations.  He has known history of difficult to control  hypertension.  Amlodipine was increased last year to 10 mg.  He reports taking his medications regularly and in spite of that, his blood pressure is significantly elevated today. He is not aware of history of sleep apnea.  He does report loud snoring but never had a sleep study done before.  The patient does not have symptoms concerning for COVID-19 infection (fever, chills, cough, or new shortness of breath).    Past Medical History:  Diagnosis Date  . Coronary artery disease 08/2015   a. 08/2015 NSTEMI/PCI: Sev mLCX (DES) & m/d RCA (DES x 3) dzs. OM 2 100 w. collats.   . Diastolic dysfunction    a. 08/2015 Echo: EF 55-60%, Gr1 DD.   Marland Kitchen. Drug abuse (HCC)    a. THC  . Hyperlipidemia   . Hypertension    a. poorly controlled  . MI (myocardial infarction) (HCC)   . Obesity   . Stented coronary artery    Past Surgical History:  Procedure Laterality Date  . CARDIAC CATHETERIZATION N/A 08/26/2015   Procedure: Left Heart Cath and Coronary Angiography;  Surgeon: Antonieta Ibaimothy J Gollan, MD;  Location: [facility] INVASIVE CV LAB;  Service: Cardiovascular;  Laterality: N/A;  . CARDIAC CATHETERIZATION N/A 08/26/2015   Procedure: Coronary Stent Intervention;  Surgeon: Alwyn Peawayne D Callwood, MD;  Location: [facility] INVASIVE CV LAB;  Service: Cardiovascular;  Laterality: N/A;     Current Meds  Medication Sig  . amLODipine (NORVASC) 10 MG tablet Take 1 tablet (10 mg total) by mouth daily.  Marland Kitchen. aspirin 81 MG chewable tablet Chew 1 tablet (81 mg total) by mouth daily.  Marland Kitchen. atorvastatin (LIPITOR) 80 MG tablet TAKE 1 TABLET BY  MOUTH ONCE DAILY AT 6 PM.  . carvedilol (COREG) 25 MG tablet Take 1 tablet by mouth twice daily (Patient taking differently: Take 25 mg by mouth 2 (two) times daily with a meal. )  . clopidogrel (PLAVIX) 75 MG tablet Take 1 tablet by mouth once daily  . nitroGLYCERIN (NITROSTAT) 0.4 MG SL tablet Place 1 tablet (0.4 mg total) under the tongue every 5 (five) minutes as needed for chest pain.     Allergies:    Lisinopril   Social History   Tobacco Use  . Smoking status: Former Smoker    Types: Cigarettes  . Smokeless tobacco: Never Used  Substance Use Topics  . Alcohol use: No  . Drug use: No     Family Hx: The patient's family history includes CAD in his father and mother; Hypertension in his father and mother; Stroke in his father.  ROS:   Please see the history of present illness.     All other systems reviewed and are negative.   Prior CV studies:   The following studies were reviewed today:    Labs/Other Tests and Data Reviewed:    EKG:  No ECG reviewed.  Recent Labs: No results found for requested labs within last 8760 hours.   Recent Lipid Panel Lab Results  Component Value Date/Time   CHOL 129 09/14/2016 07:59 AM   TRIG 67 09/14/2016 07:59 AM   HDL 35 (L) 09/14/2016 07:59 AM   CHOLHDL 3.7 09/14/2016 07:59 AM   CHOLHDL 7.4 08/26/2015 07:46 AM   LDLCALC 81 09/14/2016 07:59 AM    Wt Readings from Last 3 Encounters:  04/07/19 263 lb (119.3 kg)  04/23/18 262 lb (118.8 kg)  09/16/17 261 lb 12 oz (118.7 kg)     Objective:    Vital Signs:  BP (!) 168/108   Pulse 74   Ht 5\' 11"  (1.803 m)   Wt 263 lb (119.3 kg)   BMI 36.68 kg/m    VITAL SIGNS:  reviewed GEN:  no acute distress EYES:  sclerae anicteric, EOMI - Extraocular Movements Intact RESPIRATORY:  normal respiratory effort, symmetric expansion CARDIOVASCULAR:  no peripheral edema SKIN:  no rash, lesions or ulcers. NEURO:  alert and oriented x 3, no obvious focal deficit PSYCH:  normal affect  ASSESSMENT & PLAN:    1.  Coronary artery disease involving native coronary arteries without angina: He is doing very well overall. Continue medical therapy. Given multiple drug-eluting stents, I favor indefinite dual antiplatelet therapy.  2. Essential hypertension: Blood pressure continues to be significantly elevated in spite of carvedilol and amlodipine.  He had angioedema with lisinopril in the past.  I  elected to start him on hydrochlorothiazide 25 mg once daily.  Check labs in 2 weeks.   We should consider testing for sleep apnea in the near future.  3. Hyperlipidemia: Continue high dose atorvastatin.    Check lipid and liver profile in 2 weeks  4. Obesity: I had a prolonged discussion with him about the importance of heart healthy diet, regular exercise and weight loss.  COVID-19 Education: The signs and symptoms of COVID-19 were discussed with the patient and how to seek care for testing (follow up with PCP or arrange E-visit).  The importance of social distancing was discussed today.  Time:   Today, I have spent 25 minutes with the patient with telehealth technology discussing the above problems.     Medication Adjustments/Labs and Tests Ordered: Current medicines are reviewed at length with the patient  today.  Concerns regarding medicines are outlined above.   Tests Ordered: No orders of the defined types were placed in this encounter.   Medication Changes: No orders of the defined types were placed in this encounter.   Disposition:  Follow up in 4 month(s)  Signed, Lorine Bears, MD  04/07/2019 4:08 PM    [facility]

## 2019-04-07 NOTE — Patient Instructions (Signed)
Medication Instructions:  Add hydrochlorothiazide 25 mg once daily Continue other medications If you need a refill on your cardiac medications before your next appointment, please call your pharmacy.   Lab work: Engineer, materials metabolic profile, lipid and liver profile in 2 weeks  If you have labs (blood work) drawn today and your tests are completely normal, you will receive your results only by: Marland Kitchen MyChart Message (if you have MyChart) OR . A paper copy in the mail If you have any lab test that is abnormal or we need to change your treatment, we will call you to review the results.  Testing/Procedures: None  Follow-Up: At [facility], you and your health needs are our priority.  As part of our continuing mission to provide you with exceptional heart care, we have created designated Provider Care Teams.  These Care Teams include your primary Cardiologist (physician) and Advanced Practice Providers (APPs -  Physician Assistants and Nurse Practitioners) who all work together to provide you with the care you need, when you need it. You will need a follow up appointment in 4 months.  Please call our office 2 months in advance to schedule this appointment.  You may see Lorine Bears, MD or one of the following Advanced Practice Providers on your designated Care Team:   Nicolasa Ducking, NP Eula Listen, PA-C . Marisue Ivan, PA-C

## 2019-04-18 ENCOUNTER — Other Ambulatory Visit: Payer: Self-pay | Admitting: Cardiovascular Disease

## 2019-05-04 ENCOUNTER — Other Ambulatory Visit: Payer: Self-pay | Admitting: Cardiovascular Disease

## 2019-05-25 ENCOUNTER — Telehealth: Payer: Self-pay

## 2019-05-25 MED ORDER — AMLODIPINE BESYLATE 10 MG PO TABS: 10.0000 mg | ORAL_TABLET | Freq: Every day | ORAL | 1 refills | Status: DC

## 2019-05-25 NOTE — Telephone Encounter (Signed)
Requested Prescriptions   Signed Prescriptions Disp Refills  . amLODipine (NORVASC) 10 MG tablet 30 tablet 1    Sig: Take 1 tablet (10 mg total) by mouth daily.    Authorizing Provider: Kathlyn Sacramento A    Ordering User: Raelene Bott, BRANDY L

## 2019-07-28 ENCOUNTER — Other Ambulatory Visit: Payer: Self-pay | Admitting: Cardiovascular Disease

## 2019-08-03 ENCOUNTER — Other Ambulatory Visit: Payer: Self-pay | Admitting: Cardiovascular Disease

## 2019-08-14 ENCOUNTER — Other Ambulatory Visit: Payer: Self-pay | Admitting: Cardiovascular Disease

## 2019-08-28 ENCOUNTER — Other Ambulatory Visit: Payer: Self-pay | Admitting: Cardiovascular Disease

## 2019-09-04 ENCOUNTER — Other Ambulatory Visit: Payer: Self-pay | Admitting: Cardiovascular Disease

## 2019-09-09 ENCOUNTER — Encounter: Payer: Self-pay | Admitting: Nurse Practitioner

## 2019-09-09 ENCOUNTER — Other Ambulatory Visit: Payer: Self-pay

## 2019-09-09 ENCOUNTER — Ambulatory Visit (INDEPENDENT_AMBULATORY_CARE_PROVIDER_SITE_OTHER): Payer: BC Managed Care – PPO | Admitting: Nurse Practitioner

## 2019-09-09 VITALS — BP 144/88 | HR 81 | Ht 70.0 in | Wt 255.0 lb

## 2019-09-09 DIAGNOSIS — R0683 Snoring: Secondary | ICD-10-CM | POA: Diagnosis not present

## 2019-09-09 DIAGNOSIS — I2510 Atherosclerotic heart disease of native coronary artery without angina pectoris: Secondary | ICD-10-CM | POA: Diagnosis not present

## 2019-09-09 DIAGNOSIS — E785 Hyperlipidemia, unspecified: Secondary | ICD-10-CM

## 2019-09-09 DIAGNOSIS — I10 Essential (primary) hypertension: Secondary | ICD-10-CM

## 2019-09-09 MED ORDER — HYDROCHLOROTHIAZIDE 25 MG PO TABS: 25.0000 mg | ORAL_TABLET | Freq: Every day | ORAL | 3 refills | Status: DC

## 2019-09-09 NOTE — Patient Instructions (Signed)
Medication Instructions:   1. Your physician recommends that you continue on your current medications as directed. Please refer to the Current Medication list given to you today.  If you need a refill on your cardiac medications before your next appointment, please call your pharmacy.   Lab work:  1. Your physician recommends that you have lab work today CMET, LIPID  If you have labs (blood work) drawn today and your tests are completely normal, you will receive your results only by: Marland Kitchen MyChart Message (if you have MyChart) OR . A paper copy in the mail If you have any lab test that is abnormal or we need to change your treatment, we will call you to review the results.  Testing/Procedures:   1. Your physician has recommended that you have a sleep study. This test records several body functions during sleep, including: brain activity, eye movement, oxygen and carbon dioxide blood levels, heart rate and rhythm, breathing rate and rhythm, the flow of air through your mouth and nose, snoring, body muscle movements, and chest and belly movement.   Follow-Up: At [facility], you and your health needs are our priority.  As part of our continuing mission to provide you with exceptional heart care, we have created designated Provider Care Teams.  These Care Teams include your primary Cardiologist (physician) and Advanced Practice Providers (APPs -  Physician Assistants and Nurse Practitioners) who all work together to provide you with the care you need, when you need it. You will need a follow up appointment in 6 months.  Please call our office 2 months [facility] January 2021) schedule this appointment.  You may see Kathlyn Sacramento, MD or one of the following Advanced Practice Providers on your designated Care Team:   Murray Hodgkins, NP Christell Faith, PA-C . Marrianne Mood, PA-C  Any Other Special Instructions Will Be Listed Below (If Applicable).   1. Pulmonology referral has been placed for your  sleep study. They should call you within a week to schedule an appointment. If you have not heard anything with a week, please call [phone].

## 2019-09-09 NOTE — Progress Notes (Signed)
Office Visit    Patient Name: George Welch Date of Encounter: 09/09/2019  Primary Care Provider:  Evie Lacks, NP Primary Cardiologist:  Lorine Bears, MD  Chief Complaint    43 year old male with a history of CAD, diastolic dysfunction, hypertension, hyperlipidemia, and tobacco/marijuana abuse, who presents for CAD follow-up.  Past Medical History    Past Medical History:  Diagnosis Date   Coronary artery disease 08/2015   a. 08/2015 NSTEMI/PCI: Sev mLCX (DES) & m/d RCA (DES x 3) dzs. OM 2 100 w. collats.    Diastolic dysfunction    a. 08/2015 Echo: EF 55-60%, Gr1 DD.    Drug abuse (HCC)    a. THC   Hyperlipidemia    Hypertension    a. poorly controlled   MI (myocardial infarction) (HCC)    Obesity    Stented coronary artery    Past Surgical History:  Procedure Laterality Date   CARDIAC CATHETERIZATION N/A 08/26/2015   Procedure: Left Heart Cath and Coronary Angiography;  Surgeon: Antonieta Iba, MD;  Location: [facility] INVASIVE CV LAB;  Service: Cardiovascular;  Laterality: N/A;   CARDIAC CATHETERIZATION N/A 08/26/2015   Procedure: Coronary Stent Intervention;  Surgeon: Alwyn Pea, MD;  Location: [facility] INVASIVE CV LAB;  Service: Cardiovascular;  Laterality: N/A;    Allergies  Allergies  Allergen Reactions   Lisinopril     Angioedema    History of Present Illness    43 year old male with the above past medical history including CAD status post non-STEMI in September 2016 with PCI drug-eluting stent placement to the circumflex and RCA (DES x3).  The second obtuse marginal was noted to be occluded with collaterals.  Echocardiogram showed normal LV function at that time.  Otherwise includes hypertension, hyperlipidemia, diastolic dysfunction, tobacco/marijuana abuse, and obesity.  He was last seen via telemedicine visit in April of this year at which time he was doing well.  His blood pressure was elevated and he was placed on HCTZ.  He was supposed  to have labs checked 2 weeks later but never did.  Since his last visit, he has been stable.  He works at [employer] and is relatively active but does not routinely exercise.  He denies chest pain, dyspnea, palpitations, PND, orthopnea, dizziness, syncope, edema, or early satiety.  He says his blood pressure is typically in the 140s when he checks it at home.  Home Medications    Prior to Admission medications   Medication Sig Start Date End Date Taking? Authorizing Provider  amLODipine (NORVASC) 10 MG tablet Take 1 tablet (10 mg total) by mouth daily. 08/28/19  Yes Iran Ouch, MD  aspirin 81 MG chewable tablet Chew 1 tablet (81 mg total) by mouth daily. 01/26/16  Yes Iran Ouch, MD  atorvastatin (LIPITOR) 80 MG tablet TAKE 1 TABLET BY MOUTH ONCE DAILY AT 6PM 07/29/19  Yes Iran Ouch, MD  carvedilol (COREG) 25 MG tablet Take 1 tablet (25 mg total) by mouth 2 (two) times daily. PLEASE KEEP UPCOMING APPOINTMENT FOR FURTHER REFILLS. THANK YOU! 09/04/19  Yes Iran Ouch, MD  clopidogrel (PLAVIX) 75 MG tablet Take 1 tablet by mouth once daily 08/28/19  Yes Iran Ouch, MD  hydrochlorothiazide (HYDRODIURIL) 25 MG tablet Take 1 tablet (25 mg total) by mouth daily. 04/07/19 09/09/19 Yes Iran Ouch, MD  nitroGLYCERIN (NITROSTAT) 0.4 MG SL tablet Place 1 tablet (0.4 mg total) under the tongue every 5 (five) minutes as needed for chest pain. 04/07/19  Yes Wellington Hampshire, MD    Review of Systems    He denies chest pain, palpitations, dyspnea, pnd, orthopnea, n, v, dizziness, syncope, edema, weight gain, or early satiety.  All other systems reviewed and are otherwise negative except as noted above.  Physical Exam    VS:  BP (!) 144/88 (BP Location: Left Arm, Patient Position: Sitting, Cuff Size: Large)    Pulse 81    Ht 5\' 10"  (1.778 m)    Wt 255 lb (115.7 kg)    SpO2 98%    BMI 36.59 kg/m  , BMI Body mass index is 36.59 kg/m. GEN: Well nourished, well developed, in no  acute distress. HEENT: normal. Neck: Supple, no JVD, carotid bruits, or masses. Cardiac: RRR, no murmurs, rubs, or gallops. No clubbing, cyanosis, edema.  Radials/PT 2+ and equal bilaterally.  Respiratory:  Respirations regular and unlabored, clear to auscultation bilaterally. GI: Soft, nontender, nondistended, BS + x 4. MS: no deformity or atrophy. Skin: warm and dry, no rash. Neuro:  Strength and sensation are intact. Psych: Normal affect.  Accessory Clinical Findings    ECG personally reviewed by me today -regular sinus rhythm, 81, prior inferior infarct, borderline LVH - no acute changes.  Assessment & Plan    1.  Coronary artery disease: Status post prior non-STEMI and PCI to the RCA (drug-eluting stent x3), and circumflex (drug-eluting stent x1) in 2016.  He remains active but is not routinely exercising.  He denies any chest pain or dyspnea.  He remains on aspirin, statin, beta-blocker, and Plavix therapy, which will be continued indefinitely.  2.  Essential hypertension: Blood pressure elevated today at 144/88.  He says this is pretty typical for him.  At his last telemedicine visit, HCTZ was added however, he never followed up for labs.  I will follow-up labs today and provided that renal function electrolytes stable, I would plan to add Spironolactone.  He otherwise remains on max dose amlodipine, carvedilol, and HCTZ.  3.  Hyperlipidemia: He has not had lipids or LFTs since 2017.  He did not show up in May 2019 when we ordered these.  He is fasting today and I will check lipids and LFTs today.  Continue statin therapy.  4.  Snoring: We will refer for sleep study.  5.  Disposition: Follow-up lipids and complete metabolic panel today.  Will refer for sleep study.  Follow-up in 6 months or sooner if necessary.   Murray Hodgkins, NP 09/09/2019, 1:52 PM

## 2019-09-10 ENCOUNTER — Telehealth: Payer: Self-pay

## 2019-09-10 DIAGNOSIS — Z79899 Other long term (current) drug therapy: Secondary | ICD-10-CM

## 2019-09-10 DIAGNOSIS — I10 Essential (primary) hypertension: Secondary | ICD-10-CM

## 2019-09-10 DIAGNOSIS — E785 Hyperlipidemia, unspecified: Secondary | ICD-10-CM

## 2019-09-10 LAB — COMPREHENSIVE METABOLIC PANEL
ALT: 42 IU/L (ref 0–44)
AST: 29 IU/L (ref 0–40)
Albumin/Globulin Ratio: 1.5 (ref 1.2–2.2)
Albumin: 4.7 g/dL (ref 4.0–5.0)
Alkaline Phosphatase: 74 IU/L (ref 39–117)
BUN/Creatinine Ratio: 13 (ref 9–20)
BUN: 13 mg/dL (ref 6–24)
Bilirubin Total: 0.6 mg/dL (ref 0.0–1.2)
CO2: 22 mmol/L (ref 20–29)
Calcium: 9.8 mg/dL (ref 8.7–10.2)
Chloride: 99 mmol/L (ref 96–106)
Creatinine, Ser: 1.04 mg/dL (ref 0.76–1.27)
GFR calc Af Amer: 101 mL/min/{1.73_m2} (ref >59)
GFR calc non Af Amer: 88 mL/min/{1.73_m2} (ref >59)
Globulin, Total: 3.2 g/dL (ref 1.5–4.5)
Glucose: 89 mg/dL (ref 65–99)
Potassium: 4.3 mmol/L (ref 3.5–5.2)
Sodium: 138 mmol/L (ref 134–144)
Total Protein: 7.9 g/dL (ref 6.0–8.5)

## 2019-09-10 LAB — LIPID PANEL
Chol/HDL Ratio: 5 ratio (ref 0.0–5.0)
Cholesterol, Total: 171 mg/dL (ref 100–199)
HDL: 34 mg/dL — ABNORMAL LOW (ref >39)
LDL Chol Calc (NIH): 111 mg/dL — ABNORMAL HIGH (ref 0–99)
Triglycerides: 147 mg/dL (ref 0–149)
VLDL Cholesterol Cal: 26 mg/dL (ref 5–40)

## 2019-09-10 NOTE — Telephone Encounter (Signed)
Attempted to call patient. LMTCB 09/10/2019   

## 2019-09-10 NOTE — Telephone Encounter (Signed)
-----   Message from Theora Gianotti, NP sent at 09/10/2019  7:50 AM EDT ----- Kidney fxn and lytes wnl.  LFTs wnl.  LDL chol is not at goal (111 - prefer < 70).  Numbers looked better in the past. Please recheck his compliance w/ lipitor - if he has been missing doses, encourage better compliance and we can repeat in a few months.  If however, he reiterates good compliance, will need to add zetia 10mg  daily and f/u lipids/lfts in 6-8 wks.  We discussed yesterday that if kidney fxn and lytes ok, we would add spironolactone b/c of ongoing HTN.  Pls send in rx for spironolactone 25mg  daily.  He will need f/u bmet in a week.

## 2019-09-16 MED ORDER — EZETIMIBE 10 MG PO TABS: 10.0000 mg | ORAL_TABLET | Freq: Every day | ORAL | 3 refills | Status: DC

## 2019-09-16 MED ORDER — SPIRONOLACTONE 25 MG PO TABS: 25.0000 mg | ORAL_TABLET | Freq: Every day | ORAL | 3 refills | Status: DC

## 2019-09-16 NOTE — Telephone Encounter (Signed)
No answer. Left message to call back.   

## 2019-09-16 NOTE — Telephone Encounter (Signed)
Patient returning call.  Can leave a detailed msg if no answer

## 2019-09-16 NOTE — Telephone Encounter (Signed)
Talked to patient and he verbalized understanding of the results and plan of care.  States he has been taking his Lipitor pretty regularly other than missing a day here or there.  He will add Zetia to his medication regimen and plan to go to the [facility] in 6-8 weeks around November 11- 25 for fasting lab work.  He will also start spironolactone and go to [facility] in 1 week for BMET. Rx sent to pharmacy.

## 2019-09-21 ENCOUNTER — Other Ambulatory Visit: Payer: Self-pay | Admitting: Cardiovascular Disease

## 2019-09-28 ENCOUNTER — Encounter: Payer: Self-pay | Admitting: Internal Medicine

## 2019-09-28 ENCOUNTER — Ambulatory Visit (INDEPENDENT_AMBULATORY_CARE_PROVIDER_SITE_OTHER): Payer: BC Managed Care – PPO | Admitting: Internal Medicine

## 2019-09-28 ENCOUNTER — Other Ambulatory Visit: Payer: Self-pay

## 2019-09-28 VITALS — BP 130/70 | HR 83 | Temp 97.9°F | Ht 70.0 in | Wt 255.0 lb

## 2019-09-28 DIAGNOSIS — F17210 Nicotine dependence, cigarettes, uncomplicated: Secondary | ICD-10-CM

## 2019-09-28 DIAGNOSIS — G4719 Other hypersomnia: Secondary | ICD-10-CM | POA: Diagnosis not present

## 2019-09-28 NOTE — Patient Instructions (Signed)
Obtain Home SLeep Study  PLEASE STOP SMOKING!!

## 2019-09-28 NOTE — Progress Notes (Signed)
Name: George LivingsDustin L Welch MRN: [id number] DOB: 04-26-1976     CONSULTATION DATE: 09/28/2019  REFERRING MD : pointer  CHIEF COMPLAINT: excessive daytime sleepiness and snoring   HISTORY OF PRESENT ILLNESS:  Patient is seen today for problems and issues with sleep related to excessive daytime sleepiness Patient  has been having sleep problems for many years Patient has been having excessive daytime sleepiness for a long time Patient has been having extreme fatigue and tiredness, lack of energy +  very Loud snoring every night + struggling breathe at night and gasps for air  Patient has a strong history of coronary artery disease History of MI in 2016 Status post stents  No evidence of heart failure at this time No evidence or signs of infection at this time No respiratory distress No fevers, chills, nausea, vomiting, diarrhea No evidence of lower extremity edema No evidence hemoptysis   Discussed sleep data and reviewed with patient.  Encouraged proper weight management.  Discussed driving precautions and its relationship with hypersomnolence.  Discussed operating dangerous equipment and its relationship with hypersomnolence.  Discussed sleep hygiene, and benefits of a fixed sleep waked time.  The importance of getting eight or more hours of sleep discussed with patient.  Discussed limiting the use of the computer and television before bedtime.  Decrease naps during the day, so night time sleep will become enhanced.  Limit caffeine, and sleep deprivation.  HTN, stroke, and heart failure are potential risk factors.    EPWORTH SLEEP SCORE 4   Smoking Assessment and Cessation Counseling   Upon further questioning, Patient smokes 1/2 PPD  I have advised patient to quit/stop smoking as soon as possible due to high risk for multiple medical problems  Patient is NOT willing to quit smoking  I have advised patient that we can assist and have options of Nicotine replacement  therapy. I also advised patient on behavioral therapy and can provide oral medication therapy in conjunction with the other therapies  Follow up next Office visit  for assessment of smoking cessation  Smoking cessation counseling advised for 4 minutes    PAST MEDICAL HISTORY :   has a past medical history of Coronary artery disease (08/2015), Diastolic dysfunction, Drug abuse (HCC), Hyperlipidemia, Hypertension, MI (myocardial infarction) (HCC), Obesity, and Stented coronary artery.  has a past surgical history that includes Cardiac catheterization (N/A, 08/26/2015) and Cardiac catheterization (N/A, 08/26/2015). Prior to Admission medications   Medication Sig Start Date End Date Taking? Authorizing Provider  amLODipine (NORVASC) 10 MG tablet Take 1 tablet (10 mg total) by mouth daily. 08/28/19  Yes Iran OuchArida, Muhammad A, MD  aspirin 81 MG chewable tablet Chew 1 tablet (81 mg total) by mouth daily. 01/26/16  Yes Iran OuchArida, Muhammad A, MD  atorvastatin (LIPITOR) 80 MG tablet TAKE 1 TABLET BY MOUTH ONCE DAILY AT 6PM 09/21/19  Yes Iran OuchArida, Muhammad A, MD  carvedilol (COREG) 25 MG tablet Take 1 tablet (25 mg total) by mouth 2 (two) times daily. PLEASE KEEP UPCOMING APPOINTMENT FOR FURTHER REFILLS. THANK YOU! 09/04/19  Yes Iran OuchArida, Muhammad A, MD  clopidogrel (PLAVIX) 75 MG tablet Take 1 tablet by mouth once daily 08/28/19  Yes Iran OuchArida, Muhammad A, MD  ezetimibe (ZETIA) 10 MG tablet Take 1 tablet (10 mg total) by mouth daily. 09/16/19 12/15/19 Yes Creig HinesBerge, Christopher Ronald, NP  hydrochlorothiazide (HYDRODIURIL) 25 MG tablet Take 1 tablet (25 mg total) by mouth daily. 09/09/19 12/08/19 Yes Creig HinesBerge, Christopher Ronald, NP  nitroGLYCERIN (NITROSTAT) 0.4 MG SL tablet Place 1  tablet (0.4 mg total) under the tongue every 5 (five) minutes as needed for chest pain. 04/07/19  Yes Wellington Hampshire, MD  spironolactone (ALDACTONE) 25 MG tablet Take 1 tablet (25 mg total) by mouth daily. 09/16/19 12/15/19 Yes Theora Gianotti, NP    Allergies  Allergen Reactions   Lisinopril     Angioedema    FAMILY HISTORY:  family history includes CAD in his father and mother; Hypertension in his father and mother; Stroke in his father. SOCIAL HISTORY:  reports that he has been smoking cigarettes. He has been smoking about 0.50 packs per day. He has never used smokeless tobacco. He reports that he does not drink alcohol or use drugs.  REVIEW OF SYSTEMS:   Constitutional: Negative for fever, chills, weight loss, malaise/fatigue and diaphoresis.  HENT: Negative for hearing loss, ear pain, nosebleeds, congestion, sore throat, neck pain, tinnitus and ear discharge.   Eyes: Negative for blurred vision, double vision, photophobia, pain, discharge and redness.  Respiratory: Negative for cough, hemoptysis, sputum production, shortness of breath, wheezing and stridor.   Cardiovascular: Negative for chest pain, palpitations, orthopnea, claudication, leg swelling and PND.  Gastrointestinal: Negative for heartburn, nausea, vomiting, abdominal pain, diarrhea, constipation, blood in stool and melena.  Genitourinary: Negative for dysuria, urgency, frequency, hematuria and flank pain.  Musculoskeletal: Negative for myalgias, back pain, joint pain and falls.  Skin: Negative for itching and rash.  Neurological: Negative for dizziness, tingling, tremors, sensory change, speech change, focal weakness, seizures, loss of consciousness, weakness and headaches.  Endo/Heme/Allergies: Negative for environmental allergies and polydipsia. Does not bruise/bleed easily.  ALL OTHER ROS ARE NEGATIVE   BP 130/70 (BP Location: Left Arm, Cuff Size: Normal)    Pulse 83    Temp 97.9 F (36.6 C) (Temporal)    Ht 5\' 10"  (1.778 m)    Wt 255 lb (115.7 kg)    SpO2 98%    BMI 36.59 kg/m   Physical Examination:   GENERAL:NAD, no fevers, chills, no weakness no fatigue HEAD: Normocephalic, atraumatic.  EYES: Pupils equal, round, reactive to light. Extraocular  muscles intact. No scleral icterus.  MOUTH: Moist mucosal membrane.   EAR, NOSE, THROAT: Clear without exudates. No external lesions.  NECK: Supple. No thyromegaly. No nodules. No JVD.  PULMONARY:CTA B/L no wheezes, no crackles, no rhonchi CARDIOVASCULAR: S1 and S2. Regular rate and rhythm. No murmurs, rubs, or gallops. No edema.  GASTROINTESTINAL: Soft, nontender, nondistended. No masses. Positive bowel sounds.  MUSCULOSKELETAL: No swelling, clubbing, or edema. Range of motion full in all extremities.  NEUROLOGIC: Cranial nerves II through XII are intact. No gross focal neurological deficits.  SKIN: No ulceration, lesions, rashes, or cyanosis. Skin warm and dry. Turgor intact.  PSYCHIATRIC: Mood, affect within normal limits. The patient is awake, alert and oriented x 3. Insight, judgment intact.      ASSESSMENT AND PLAN SYNOPSIS  Signs and symptoms of sleep apnea Patient needs sleep study for definitive diagnosis  Overweight -recommend significant weight loss -recommend changing diet  Deconditioned state -Recommend increased daily activity and exercise  Tobacco cessation counseling provided  COVID-19 EDUCATION: The signs and symptoms of COVID-19 were discussed with the patient and how to seek care for testing.  The importance of social distancing was discussed today. Hand Washing Techniques and avoid touching face was advised.     MEDICATION ADJUSTMENTS/LABS AND TESTS ORDERED: Recommend sleep study Recommend smoking cessation   CURRENT MEDICATIONS REVIEWED AT LENGTH WITH PATIENT TODAY   Patient satisfied with Plan  of action and management. All questions answered  Follow up in 3 months   Arriona Prest Santiago Glad, M.D.  [facility]  Medical Director [facility] Medical Director [facility] Cardio-Pulmonary Department

## 2019-10-05 ENCOUNTER — Other Ambulatory Visit: Payer: Self-pay | Admitting: Cardiovascular Disease

## 2019-10-07 ENCOUNTER — Other Ambulatory Visit: Payer: Self-pay | Admitting: Cardiovascular Disease

## 2019-11-03 ENCOUNTER — Other Ambulatory Visit
Admission: RE | Admit: 2019-11-03 | Discharge: 2019-11-03 | Disposition: A | Discharge status: Home / Self Care | Payer: BC Managed Care – PPO | Source: Ambulatory Visit | Attending: Nurse Practitioner | Admitting: Nurse Practitioner

## 2019-11-03 ENCOUNTER — Telehealth: Payer: Self-pay

## 2019-11-03 DIAGNOSIS — E785 Hyperlipidemia, unspecified: Secondary | ICD-10-CM | POA: Diagnosis not present

## 2019-11-03 DIAGNOSIS — Z79899 Other long term (current) drug therapy: Secondary | ICD-10-CM | POA: Diagnosis not present

## 2019-11-03 DIAGNOSIS — I10 Essential (primary) hypertension: Secondary | ICD-10-CM | POA: Insufficient documentation

## 2019-11-03 LAB — BASIC METABOLIC PANEL
Anion gap: 10 (ref 5–15)
BUN: 17 mg/dL (ref 6–20)
CO2: 25 mmol/L (ref 22–32)
Calcium: 9.5 mg/dL (ref 8.9–10.3)
Chloride: 101 mmol/L (ref 98–111)
Creatinine, Ser: 0.98 mg/dL (ref 0.61–1.24)
GFR calc Af Amer: >60 mL/min (ref >60)
GFR calc non Af Amer: >60 mL/min (ref >60)
Glucose, Bld: 105 mg/dL — ABNORMAL HIGH (ref 70–99)
Potassium: 3.6 mmol/L (ref 3.5–5.1)
Sodium: 136 mmol/L (ref 135–145)

## 2019-11-03 LAB — HEPATIC FUNCTION PANEL
ALT: 40 U/L (ref 0–44)
AST: 31 U/L (ref 15–41)
Albumin: 4.5 g/dL (ref 3.5–5.0)
Alkaline Phosphatase: 58 U/L (ref 38–126)
Bilirubin, Direct: <0.1 mg/dL (ref 0.0–0.2)
Total Bilirubin: 0.6 mg/dL (ref 0.3–1.2)
Total Protein: 8.3 g/dL — ABNORMAL HIGH (ref 6.5–8.1)

## 2019-11-03 LAB — LIPID PANEL
Cholesterol: 163 mg/dL (ref 0–200)
HDL: 30 mg/dL — ABNORMAL LOW (ref >40)
LDL Cholesterol: 72 mg/dL (ref 0–99)
Total CHOL/HDL Ratio: 5.4 RATIO
Triglycerides: 303 mg/dL — ABNORMAL HIGH (ref <150)
VLDL: 61 mg/dL — ABNORMAL HIGH (ref 0–40)

## 2019-11-03 NOTE — Telephone Encounter (Signed)
-----   Message from Theora Gianotti, NP sent at 11/03/2019  1:06 PM EST ----- Renal fxn, lytes, lft's, stable.  Lipids better - LDL now 72.  I note that TG are much higher than previous. Gluc also mildly elevated.  I suspect this isn't a truly fasting sample, thus LDL might actually be lower.

## 2019-11-03 NOTE — Telephone Encounter (Signed)
Attempted to call patient. LMTCB 11/03/2019   

## 2019-11-04 NOTE — Telephone Encounter (Signed)
Call to patient to review labs, pt verbalized understanding and had no further questions at this time.   Advised pt to call for any further questions or concerns.

## 2019-11-04 NOTE — Telephone Encounter (Signed)
Attempted to call patient. LMTCB 11/04/2019   

## 2019-11-05 ENCOUNTER — Other Ambulatory Visit: Payer: Self-pay | Admitting: Cardiovascular Disease

## 2020-01-25 ENCOUNTER — Ambulatory Visit (INDEPENDENT_AMBULATORY_CARE_PROVIDER_SITE_OTHER): Payer: BC Managed Care – PPO | Admitting: Internal Medicine

## 2020-01-25 ENCOUNTER — Encounter: Payer: Self-pay | Admitting: Internal Medicine

## 2020-01-25 DIAGNOSIS — G4733 Obstructive sleep apnea (adult) (pediatric): Secondary | ICD-10-CM

## 2020-01-25 NOTE — Progress Notes (Signed)
   Name: George Welch MRN: [id number] DOB: [birth year]     CONSULTATION DATE: 01/25/2020  REFERRING MD : pointer  CHIEF COMPLAINT: FOLLOW UP excessive daytime sleepiness and snoring   PATIENT IS A NO SHOW WE CALLED MULTIPLE TIMES AND LEFT MESSAGES  WILL PLAN TO RESCHEDULE AT A LATER DATE/TIME    PAST MEDICAL HISTORY :   has a past medical history of Coronary artery disease (08/2015), Diastolic dysfunction, Drug abuse (HCC), Hyperlipidemia, Hypertension, MI (myocardial infarction) (HCC), Obesity, and Stented coronary artery.  has a past surgical history that includes Cardiac catheterization (N/A, 08/26/2015) and Cardiac catheterization (N/A, 08/26/2015). Prior to Admission medications   Medication Sig Start Date End Date Taking? Authorizing Provider  amLODipine (NORVASC) 10 MG tablet Take 1 tablet (10 mg total) by mouth daily. 08/28/19  Yes Iran Ouch, MD  aspirin 81 MG chewable tablet Chew 1 tablet (81 mg total) by mouth daily. 01/26/16  Yes Iran Ouch, MD  atorvastatin (LIPITOR) 80 MG tablet TAKE 1 TABLET BY MOUTH ONCE DAILY AT 6PM 09/21/19  Yes Iran Ouch, MD  carvedilol (COREG) 25 MG tablet Take 1 tablet (25 mg total) by mouth 2 (two) times daily. PLEASE KEEP UPCOMING APPOINTMENT FOR FURTHER REFILLS. THANK YOU! 09/04/19  Yes Iran Ouch, MD  clopidogrel (PLAVIX) 75 MG tablet Take 1 tablet by mouth once daily 08/28/19  Yes Iran Ouch, MD  ezetimibe (ZETIA) 10 MG tablet Take 1 tablet (10 mg total) by mouth daily. 09/16/19 12/15/19 Yes Creig Hines, NP  hydrochlorothiazide (HYDRODIURIL) 25 MG tablet Take 1 tablet (25 mg total) by mouth daily. 09/09/19 12/08/19 Yes Creig Hines, NP     Wallis Bamberg Santiago Glad, M.D.  [facility]  Medical Director [facility] Medical Director [facility] Cardio-Pulmonary Department

## 2020-01-29 ENCOUNTER — Other Ambulatory Visit: Payer: Self-pay

## 2020-01-29 MED ORDER — CARVEDILOL 25 MG PO TABS: 25.0000 mg | ORAL_TABLET | Freq: Two times a day (BID) | ORAL | 0 refills | Status: DC

## 2020-02-26 ENCOUNTER — Telehealth: Payer: Self-pay

## 2020-02-26 NOTE — Telephone Encounter (Signed)
I have left a message on both phone numbers that we have for the patient to return the call to reschedule picking up the HST machine

## 2020-03-03 NOTE — Telephone Encounter (Signed)
I have left a message again today on both numbers that we have for the patient to return the call to reschedule picking up the HST machine

## 2020-03-23 ENCOUNTER — Telehealth: Payer: Self-pay | Admitting: Internal Medicine

## 2020-03-23 ENCOUNTER — Encounter: Payer: Self-pay | Admitting: Internal Medicine

## 2020-03-23 NOTE — Telephone Encounter (Signed)
Dr. Belia Heman you saw this patient on 09/28/2019 and put in order for home sleep study.  Couldn't get PA until 11/03/19 when insurance card was scanned into Epic. Not sure what happened but patient never got scheduled to do home sleep study.  Dr. Belia Heman you saw the patient again on 01/25/2020.  We had to get another PA to do home sleep study.  Got PA on 02/09/2020. I spoke with patient on 02/16/20 and had him scheduled to pick the HST machine on 02/26/20.  He called on 02/26/20 to [facility] and wanted to be called back to reschedule.  I called on 03/12 and 03/18 left messages both times for the patient to return my call to reschedule picking up the HST machine as of today I haven't heard anything from him. Letter will be mailed to Patient per new Protocol

## 2020-03-23 NOTE — Telephone Encounter (Signed)
Ok thanks for info

## 2020-04-06 DIAGNOSIS — Z125 Encounter for screening for malignant neoplasm of prostate: Secondary | ICD-10-CM | POA: Diagnosis not present

## 2020-04-06 DIAGNOSIS — E785 Hyperlipidemia, unspecified: Secondary | ICD-10-CM | POA: Diagnosis not present

## 2020-04-06 DIAGNOSIS — I10 Essential (primary) hypertension: Secondary | ICD-10-CM | POA: Diagnosis not present

## 2020-04-06 DIAGNOSIS — F5221 Male erectile disorder: Secondary | ICD-10-CM | POA: Diagnosis not present

## 2020-04-06 DIAGNOSIS — E669 Obesity, unspecified: Secondary | ICD-10-CM | POA: Diagnosis not present

## 2020-04-06 DIAGNOSIS — Z1389 Encounter for screening for other disorder: Secondary | ICD-10-CM | POA: Diagnosis not present

## 2020-04-06 DIAGNOSIS — I2510 Atherosclerotic heart disease of native coronary artery without angina pectoris: Secondary | ICD-10-CM | POA: Diagnosis not present

## 2020-04-13 ENCOUNTER — Other Ambulatory Visit: Payer: Self-pay | Admitting: Cardiovascular Disease

## 2020-04-13 NOTE — Telephone Encounter (Signed)
Patient needs an appointment for further refills, Thanks !  

## 2020-04-13 NOTE — Telephone Encounter (Signed)
4/30 berge

## 2020-04-15 ENCOUNTER — Ambulatory Visit (INDEPENDENT_AMBULATORY_CARE_PROVIDER_SITE_OTHER): Payer: BC Managed Care – PPO | Admitting: Nurse Practitioner

## 2020-04-15 ENCOUNTER — Encounter: Payer: Self-pay | Admitting: Nurse Practitioner

## 2020-04-15 ENCOUNTER — Other Ambulatory Visit: Payer: Self-pay

## 2020-04-15 VITALS — BP 130/90 | HR 77 | Ht 70.0 in | Wt 257.2 lb

## 2020-04-15 DIAGNOSIS — I10 Essential (primary) hypertension: Secondary | ICD-10-CM | POA: Diagnosis not present

## 2020-04-15 DIAGNOSIS — Z720 Tobacco use: Secondary | ICD-10-CM

## 2020-04-15 DIAGNOSIS — I2510 Atherosclerotic heart disease of native coronary artery without angina pectoris: Secondary | ICD-10-CM | POA: Diagnosis not present

## 2020-04-15 DIAGNOSIS — E785 Hyperlipidemia, unspecified: Secondary | ICD-10-CM

## 2020-04-15 MED ORDER — NITROGLYCERIN 0.4 MG SL SUBL: 0.4000 mg | SUBLINGUAL_TABLET | SUBLINGUAL | 3 refills | Status: DC | PRN

## 2020-04-15 MED ORDER — CLOPIDOGREL BISULFATE 75 MG PO TABS: 75.0000 mg | ORAL_TABLET | Freq: Every day | ORAL | 11 refills | Status: DC

## 2020-04-15 MED ORDER — CARVEDILOL 25 MG PO TABS: 25.0000 mg | ORAL_TABLET | Freq: Two times a day (BID) | ORAL | 11 refills | Status: DC

## 2020-04-15 MED ORDER — ATORVASTATIN CALCIUM 80 MG PO TABS: 80.0000 mg | ORAL_TABLET | Freq: Every day | ORAL | 11 refills | Status: DC

## 2020-04-15 NOTE — Patient Instructions (Signed)
Medication Instructions:  Your physician recommends that you continue on your current medications as directed. Please refer to the Current Medication list given to you today.  *If you need a refill on your cardiac medications before your next appointment, please call your pharmacy*   Lab Work: Your physician recommends that you have lab work today(BMET)  If you have labs (blood work) drawn today and your tests are completely normal, you will receive your results only by: Marland Kitchen MyChart Message (if you have MyChart) OR . A paper copy in the mail If you have any lab test that is abnormal or we need to change your treatment, we will call you to review the results.   Testing/Procedures: None ordered    Follow-Up: At [facility], you and your health needs are our priority.  As part of our continuing mission to provide you with exceptional heart care, we have created designated Provider Care Teams.  These Care Teams include your primary Cardiologist (physician) and Advanced Practice Providers (APPs -  Physician Assistants and Nurse Practitioners) who all work together to provide you with the care you need, when you need it.  We recommend signing up for the patient portal called "MyChart".  Sign up information is provided on this After Visit Summary.  MyChart is used to connect with patients for Virtual Visits (Telemedicine).  Patients are able to view lab/test results, encounter notes, upcoming appointments, etc.  Non-urgent messages can be sent to your provider as well.   To learn more about what you can do with MyChart, go to [email or web address].    Your next appointment:   6 month(s)  The format for your next appointment:   In Person  Provider:    You may see Lorine Bears, MD or Nicolasa Ducking, NP.

## 2020-04-15 NOTE — Progress Notes (Signed)
Office Visit    Patient Name: George Welch Date of Encounter: 04/15/2020  Primary Care Provider:  Laneta Simmers, NP Primary Cardiologist:  Kathlyn Sacramento, MD  Chief Complaint    44 y/o ? with a history of CAD, diastolic dysfunction, hypertension, hyperlipidemia, and tobacco/marijuana abuse, who presents for CAD follow-up.  Past Medical History    Past Medical History:  Diagnosis Date  . Coronary artery disease 08/2015   a. 08/2015 NSTEMI/PCI: Sev mLCX (DES) & m/d RCA (DES x 3) dzs. OM 2 100 w. collats.   . Diastolic dysfunction    a. 08/2015 Echo: EF 55-60%, Gr1 DD.   [name] abuse ([place])    a. THC  . Hyperlipidemia   . Hypertension    a. poorly controlled  . MI (myocardial infarction) ([place])   . Obesity   . Stented coronary artery    Past Surgical History:  Procedure Laterality Date  . CARDIAC CATHETERIZATION N/A 08/26/2015   Procedure: Left Heart Cath and Coronary Angiography;  Surgeon: Minna Merritts, MD;  Location: [place] CV [facility];  Service: Cardiovascular;  Laterality: N/A;  . CARDIAC CATHETERIZATION N/A 08/26/2015   Procedure: Coronary Stent Intervention;  Surgeon: Yolonda Kida, MD;  Location: [place] CV LAB;  Service: Cardiovascular;  Laterality: N/A;    Allergies  Allergies  Allergen Reactions  . Lisinopril     Angioedema    History of Present Illness    44 year old male with above past medical history including CAD status post non-STEMI in September 2016 with PCI drug-eluting stent placement to the circumflex and RCA (DES x3).  The second obtuse marginal was noted to be occluded and filled via collaterals.  Echocardiogram showed normal LV function at the time.  Other history includes hypertension, hyperlipidemia, diastolic dysfunction, tobacco/marijuana abuse, and obesity.  At his last clinic visit in September 2020, he was hypertensive and spironolactone therapy was added.  I also added Zetia 10 mg daily in the setting of an LDL of 111.   LDL improved to 72 on Zetia.  Renal function and electrolytes were stable on lab work in November.  Over the past several months, he has continued to do well.  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  He denies chest pain, dyspnea, palpitations, PND, orthopnea, dizziness, syncope, edema, or early satiety.  He reports good compliance with his medications.  He does not routinely check his blood pressure.  Home Medications    Prior to Admission medications   Medication Sig Start Date End Date Taking? Authorizing Provider  amLODipine (NORVASC) 10 MG tablet Take 1 tablet by mouth once daily 04/13/20   Wellington Hampshire, MD  aspirin 81 MG chewable tablet Chew 1 tablet (81 mg total) by mouth daily. 01/26/16   Wellington Hampshire, MD  atorvastatin (LIPITOR) 80 MG tablet TAKE 1 TABLET BY MOUTH ONCE DAILY AT 6PM 09/21/19   Wellington Hampshire, MD  carvedilol (COREG) 25 MG tablet Take 1 tablet (25 mg total) by mouth 2 (two) times daily with a meal. Please call to schedule follow-up appointment for further refills. Thank you! 01/29/20   Wellington Hampshire, MD  clopidogrel (PLAVIX) 75 MG tablet Take 1 tablet by mouth once daily 04/13/20   Wellington Hampshire, MD  ezetimibe (ZETIA) 10 MG tablet Take 1 tablet (10 mg total) by mouth daily. 09/16/19 12/15/19  Theora Gianotti, NP  hydrochlorothiazide (HYDRODIURIL) 25 MG tablet Take 1 tablet (25  mg total) by mouth daily. 09/09/19 12/08/19  Creig Hines, NP  nitroGLYCERIN (NITROSTAT) 0.4 MG SL tablet Place 1 tablet (0.4 mg total) under the tongue every 5 (five) minutes as needed for chest pain. 04/07/19   Iran Ouch, MD  spironolactone (ALDACTONE) 25 MG tablet Take 1 tablet (25 mg total) by mouth daily. 09/16/19 12/15/19  Creig Hines, NP    Review of Systems    He denies chest pain, palpitations, dyspnea, pnd, orthopnea, n, v, dizziness, syncope, edema, weight gain, or early satiety.  All  other systems reviewed and are otherwise negative except as noted above.  Physical Exam    VS:  BP 130/90 (BP Location: Left Arm, Patient Position: Sitting, Cuff Size: Large)   Pulse 77   Ht 5\' 10"  (1.778 m)   Wt 257 lb 4 oz (116.7 kg)   SpO2 98%   BMI 36.91 kg/m  , BMI Body mass index is 36.91 kg/m. GEN: Well nourished, well developed, in no acute distress. HEENT: normal. Neck: Supple, no JVD, carotid bruits, or masses. Cardiac: RRR, no murmurs, rubs, or gallops. No clubbing, cyanosis, edema.  Radials/PT 2+ and equal bilaterally.  Respiratory:  Respirations regular and unlabored, clear to auscultation bilaterally. GI: Soft, nontender, nondistended, BS + x 4. MS: no deformity or atrophy. Skin: warm and dry, no rash. Neuro:  Strength and sensation are intact. Psych: Normal affect.  Accessory Clinical Findings    ECG personally reviewed by me today -regular sinus rhythm, 77, inferior infarct- no acute changes.  Lab Results  Component Value Date   WBC 7.6 08/28/2015   HGB 13.6 08/28/2015   HCT 41.7 08/28/2015   MCV 86.0 08/28/2015   PLT 198 08/28/2015   Lab Results  Component Value Date   CREATININE 0.98 11/03/2019   BUN 17 11/03/2019   NA 136 11/03/2019   K 3.6 11/03/2019   CL 101 11/03/2019   CO2 25 11/03/2019   Lab Results  Component Value Date   ALT 40 11/03/2019   AST 31 11/03/2019   ALKPHOS 58 11/03/2019   BILITOT 0.6 11/03/2019   Lab Results  Component Value Date   CHOL 163 11/03/2019   HDL 30 (L) 11/03/2019   LDLCALC 72 11/03/2019   TRIG 303 (H) 11/03/2019   CHOLHDL 5.4 11/03/2019    Lab Results  Component Value Date   HGBA1C 5.7 08/26/2015    Assessment & Plan    1.  Coronary artery disease: Status post non-STEMI in September 2016 with finding of severe circumflex and RCA disease requiring multiple drug-eluting stents.  He has been doing well without chest pain or dyspnea.  Does not routinely exercise but notes good activity tolerance at work.   I did encourage him to seek 30 minutes of regular exercise daily.  He remains on aspirin, statin, beta-blocker, Plavix.  2.  Essential hypertension: Blood pressure mildly elevated today at 130/90, which is actually much improved for him.  He does not routinely check this at home.  We discussed the importance of limiting sodium and processed food intake and seeking regular exercise.  He remains on amlodipine, carvedilol, HCTZ, and spironolactone.  I will follow-up a basic metabolic panel today.  If renal function stable, will consider titrating spironolactone to 50 mg daily.  Of note, he previously had angioedema with ACE inhibitors.  3.  Hyperlipidemia: LDL was 72 in November 2020 on high potency atorvastatin and Zetia 10 mg daily.  Could consider bempedoic acid or PCSK9 inhibitor in  the future but we discussed lifestyle modifications that he would like to try this first.  4.  Tobacco abuse: Cessation advised.  5.  Disposition: Follow-up basic metabolic panel today.  Follow-up in 6 months or sooner if necessary.  Of note, patient inquired about obtaining a handicap parking placard.  I did explain that in the absence of chest pain or dyspnea and with report of good activity tolerance, he does not qualify.  He verbalized understanding.  Nicolasa Ducking, NP 04/15/2020, 3:22 PM

## 2020-04-16 LAB — BASIC METABOLIC PANEL
BUN/Creatinine Ratio: 11 (ref 9–20)
BUN: 12 mg/dL (ref 6–24)
CO2: 22 mmol/L (ref 20–29)
Calcium: 10 mg/dL (ref 8.7–10.2)
Chloride: 100 mmol/L (ref 96–106)
Creatinine, Ser: 1.06 mg/dL (ref 0.76–1.27)
GFR calc Af Amer: 98 mL/min/{1.73_m2} (ref >59)
GFR calc non Af Amer: 85 mL/min/{1.73_m2} (ref >59)
Glucose: 86 mg/dL (ref 65–99)
Potassium: 4.5 mmol/L (ref 3.5–5.2)
Sodium: 137 mmol/L (ref 134–144)

## 2020-05-14 ENCOUNTER — Other Ambulatory Visit: Payer: Self-pay | Admitting: Cardiovascular Disease

## 2020-07-29 DIAGNOSIS — S60221A Contusion of right hand, initial encounter: Secondary | ICD-10-CM | POA: Diagnosis not present

## 2020-07-29 DIAGNOSIS — S62336A Displaced fracture of neck of fifth metacarpal bone, right hand, initial encounter for closed fracture: Secondary | ICD-10-CM | POA: Diagnosis not present

## 2020-08-10 DIAGNOSIS — S62336A Displaced fracture of neck of fifth metacarpal bone, right hand, initial encounter for closed fracture: Secondary | ICD-10-CM | POA: Diagnosis not present

## 2020-09-07 DIAGNOSIS — S62336A Displaced fracture of neck of fifth metacarpal bone, right hand, initial encounter for closed fracture: Secondary | ICD-10-CM | POA: Diagnosis not present

## 2020-10-21 ENCOUNTER — Encounter: Payer: Self-pay | Admitting: Cardiovascular Disease

## 2020-10-21 ENCOUNTER — Ambulatory Visit: Payer: BC Managed Care – PPO | Admitting: Cardiovascular Disease

## 2020-10-21 ENCOUNTER — Other Ambulatory Visit: Payer: Self-pay

## 2020-10-21 VITALS — BP 120/82 | HR 68 | Ht 70.0 in | Wt 250.1 lb

## 2020-10-21 DIAGNOSIS — E785 Hyperlipidemia, unspecified: Secondary | ICD-10-CM

## 2020-10-21 DIAGNOSIS — I10 Essential (primary) hypertension: Secondary | ICD-10-CM | POA: Diagnosis not present

## 2020-10-21 DIAGNOSIS — I2510 Atherosclerotic heart disease of native coronary artery without angina pectoris: Secondary | ICD-10-CM | POA: Diagnosis not present

## 2020-10-21 MED ORDER — EPLERENONE 25 MG PO TABS: 25.0000 mg | ORAL_TABLET | Freq: Every day | ORAL | 2 refills | Status: DC

## 2020-10-21 NOTE — Patient Instructions (Signed)
Medication Instructions:  - Your physician has recommended you make the following change in your medication:   1) STOP spironolactone  2) START eplerenone 25 mg- take 1 tablet by mouth once daily   *If you need a refill on your cardiac medications before your next appointment, please call your pharmacy*   Lab Work: - none ordered  If you have labs (blood work) drawn today and your tests are completely normal, you will receive your results only by: Marland Kitchen MyChart Message (if you have MyChart) OR . A paper copy in the mail If you have any lab test that is abnormal or we need to change your treatment, we will call you to review the results.   Testing/Procedures: - none ordered   Follow-Up: At [facility], you and your health needs are our priority.  As part of our continuing mission to provide you with exceptional heart care, we have created designated Provider Care Teams.  These Care Teams include your primary Cardiologist (physician) and Advanced Practice Providers (APPs -  Physician Assistants and Nurse Practitioners) who all work together to provide you with the care you need, when you need it.  We recommend signing up for the patient portal called "MyChart".  Sign up information is provided on this After Visit Summary.  MyChart is used to connect with patients for Virtual Visits (Telemedicine).  Patients are able to view lab/test results, encounter notes, upcoming appointments, etc.  Non-urgent messages can be sent to your provider as well.   To learn more about what you can do with MyChart, go to [email or web address].    Your next appointment:   6 month(s)  The format for your next appointment:   In Person  Provider:   You may see Lorine Bears, MD or one of the following Advanced Practice Providers on your designated Care Team:    Nicolasa Ducking, NP  Eula Listen, PA-C  Marisue Ivan, PA-C  Cadence Fransico Michael, [place]    Other Instructions  Eplerenone Oral  Tablets What is this medicine? EPLERENONE (e PLER en one) is used to treat high blood pressure. This medicine is also used to improve symptoms of heart failure. This medicine may be used for other purposes; ask your health care provider or pharmacist if you have questions. COMMON BRAND NAME(S): Inspra What should I tell my health care provider before I take this medicine? They need to know if you have any of these conditions:  diabetes  high blood level of potassium  if you are on a special diet, such as a low-salt diet and are using dietary salt substitutes  kidney disease  liver disease  an unusual or allergic reaction to eplerenone, other medicines, foods, dyes, or preservatives  pregnant or trying to get pregnant  breast-feeding How should I use this medicine? Take this medicine by mouth with a glass of water. Follow the directions on the prescription label. You may take this medicine with or without food. Take your doses at regular intervals. Do not take your medicine more often than directed. Do not stop taking except on the advice of your doctor or health care professional. Talk to your pediatrician regarding the use of this medicine in children. Special care may be needed. Overdosage: If you think you have taken too much of this medicine contact a poison control center or emergency room at once. NOTE: This medicine is only for you. Do not share this medicine with others. What if I miss a dose? If you miss a dose,  take it as soon as you can. If it is almost time for your next dose, take only that dose. Do not take double or extra doses. What may interact with this medicine? Do not take this medicine with any of the following medications:  boceprevir  certain antibiotics like chloramphenicol, clarithromycin, dalfopristin; quinupristin, and telithromycin  certain diuretics like amiloride, spironolactone, and triamterene  certain medicines for fungal infections like  itraconazole, ketoconazole, posaconazole, and voriconazole  certain medicines for HIV or AIDS like atazanavir, cobicistat, darunavir, delavirdine, fosamprenavir, indinavir, nelfinavir, ritonavir, saquinavir boosted with ritonavir, and tipranavir  conivaptan  grapefruit and grapefruit juice  idelalisib  mifepristone  nefazodone  potassium salts or supplements This medicine may also interact with the following medications:  certain medicines for high blood pressure like enalapril, candesartan, lisinopril, and valsartan  erythromycin  fluconazole  lithium  NSAIDs, medicines for pain and inflammation, like ibuprofen or naproxen  verapamil This list may not describe all possible interactions. Give your health care provider a list of all the medicines, herbs, non-prescription drugs, or dietary supplements you use. Also tell them if you smoke, drink alcohol, or use illegal drugs. Some items may interact with your medicine. What should I watch for while using this medicine? Visit your doctor or health care professional for regular checks on your progress. Check your blood pressure as directed. Ask your doctor or health care professional what your blood pressure should be and when you should contact him or her. You may need to be on a special diet while taking this medicine. Ask your doctor. Also, ask how many glasses of fluid you need to drink each day. You must not get dehydrated. You may get drowsy or dizzy. Do not drive, use machinery, or do anything that needs mental alertness until you know how this medicine affects you. Do not stand or sit up quickly, especially if you are an older patient. This reduces the risk of dizzy or fainting spells. Alcohol may interfere with the effect of this medicine. Avoid alcoholic drinks. What side effects may I notice from receiving this medicine? Side effects that you should report to your doctor or health care professional as soon as  possible:  allergic reactions like skin rash, itching or hives, swelling of the face, lips, or tongue  chest pain  confusion  enlarged breasts or breast pain in males  fast or irregular heartbeat, palpitations  increased hair growth in females  irregular menstrual periods  sexual difficulty  unusually weak or tired Side effects that usually do not require medical attention (report to your doctor or health care professional if they continue or are bothersome):  cough  diarrhea  fatigue  headache  stomach pain This list may not describe all possible side effects. Call your doctor for medical advice about side effects. You may report side effects to FDA at 1-800-FDA-1088. Where should I keep my medicine? Keep out of the reach of children. Store at room temperature between 15 and 30 degrees C (59 and 86 degrees F). Throw away any unused medicine after the expiration date. NOTE: This sheet is a summary. It may not cover all possible information. If you have questions about this medicine, talk to your doctor, pharmacist, or health care provider.  2020 Elsevier/Gold Standard (2017-01-02 14:22:43)

## 2020-10-21 NOTE — Progress Notes (Signed)
Cardiology Office Note   Date:  10/21/2020   ID:  George Welch, DOB 03/22/1976, MRN [id number]  PCP:  Evie Lacks, NP  Cardiologist:   Lorine Bears, MD   Chief Complaint  Patient presents with  . other    6 month follow up. Meds reviewed by the pt. verbally. Pt. c/o chest discomfort at times.        History of Present Illness: George Welch is a 44 y.o. male who presents for a follow-up visit regarding coronary artery disease and hypertension.  He has known history of hypertension, obesity, hyperlipidemia and marijuana use. He presented in September 2016 with non-ST elevation myocardial infarction . Cardiac catheterization showed severe two-vessel coronary artery disease involving mid left circumflex, distal and mid right coronary artery. OM 2 was also noted to be occluded with collaterals. He underwent successful angioplasty and drug-eluting stent placement to the left circumflex. He required 3 drug-eluting stents to the RCA. Echocardiogram showed normal LV systolic function with no significant valvular abnormalities. He had angioedema with lisinopril.   Spironolactone and Zetia were added in October of last year.  He has been doing very well with no recent chest pain, shortness of breath or palpitations.  He complains of significant tenderness affecting the left nipple.    Past Medical History:  Diagnosis Date  . Coronary artery disease 08/2015   a. 08/2015 NSTEMI/PCI: Sev mLCX (DES) & m/d RCA (DES x 3) dzs. OM 2 100 w. collats.   . Diastolic dysfunction    a. 08/2015 Echo: EF 55-60%, Gr1 DD.   [name] abuse (HCC)    a. THC  . Hyperlipidemia   . Hypertension   . MI (myocardial infarction) (HCC)   . Obesity   . Stented coronary artery     Past Surgical History:  Procedure Laterality Date  . CARDIAC CATHETERIZATION N/A 08/26/2015   Procedure: Left Heart Cath and Coronary Angiography;  Surgeon: Antonieta Iba, MD;  Location: [facility] INVASIVE CV LAB;  Service:  Cardiovascular;  Laterality: N/A;  . CARDIAC CATHETERIZATION N/A 08/26/2015   Procedure: Coronary Stent Intervention;  Surgeon: Alwyn Pea, MD;  Location: [facility] INVASIVE CV LAB;  Service: Cardiovascular;  Laterality: N/A;     Current Outpatient Medications  Medication Sig Dispense Refill  . amLODipine (NORVASC) 10 MG tablet Take 1 tablet by mouth once daily 30 tablet 4  . aspirin 81 MG chewable tablet Chew 1 tablet (81 mg total) by mouth daily. 30 tablet 3  . atorvastatin (LIPITOR) 80 MG tablet Take 1 tablet (80 mg total) by mouth daily. 30 tablet 11  . carvedilol (COREG) 25 MG tablet Take 1 tablet (25 mg total) by mouth 2 (two) times daily with a meal. Please call to schedule follow-up appointment for further refills. Thank you! 60 tablet 11  . clopidogrel (PLAVIX) 75 MG tablet Take 1 tablet (75 mg total) by mouth daily. 30 tablet 11  . ezetimibe (ZETIA) 10 MG tablet Take 1 tablet (10 mg total) by mouth daily. 90 tablet 3  . hydrochlorothiazide (HYDRODIURIL) 25 MG tablet Take 1 tablet (25 mg total) by mouth daily. 90 tablet 3  . nitroGLYCERIN (NITROSTAT) 0.4 MG SL tablet Place 1 tablet (0.4 mg total) under the tongue every 5 (five) minutes as needed for chest pain. 25 tablet 3  . spironolactone (ALDACTONE) 25 MG tablet Take 1 tablet (25 mg total) by mouth daily. 90 tablet 3   No current facility-administered medications for this visit.  Allergies:   Lisinopril    Social History:  The patient  reports that he has been smoking cigarettes. He has been smoking about 0.50 packs per day. He has never used smokeless tobacco. He reports current alcohol use. He reports previous drug use.   Family History:  The patient's family history includes CAD in his father and mother; Hypertension in his father and mother; Stroke in his father.    ROS:  Please see the history of present illness.   Otherwise, review of systems are positive for none.   All other systems are reviewed and negative.     PHYSICAL EXAM: VS:  BP 120/82 (BP Location: Left Arm, Patient Position: Sitting, Cuff Size: Large)   Pulse 68   Ht 5\' 10"  (1.778 m)   Wt 250 lb 2 oz (113.5 kg)   SpO2 98%   BMI 35.89 kg/m  , BMI Body mass index is 35.89 kg/m. GEN: Well nourished, well developed, in no acute distress  HEENT: normal  Neck: no JVD, carotid bruits, or masses Cardiac: RRR; no murmurs, rubs, or gallops,no edema  Respiratory:  clear to auscultation bilaterally, normal work of breathing GI: soft, nontender, nondistended, + BS MS: no deformity or atrophy  Skin: warm and dry, no rash Neuro:  Strength and sensation are intact Psych: euthymic mood, full affect   EKG:  EKG is ordered today. The ekg ordered today demonstrates normal sinus rhythm with no significant ST or T wave changes.   Recent Labs: 11/03/2019: ALT 40 04/15/2020: BUN 12; Creatinine, Ser 1.06; Potassium 4.5; Sodium 137    Lipid Panel    Component Value Date/Time   CHOL 163 11/03/2019 0905   CHOL 171 09/09/2019 1414   TRIG 303 (H) 11/03/2019 0905   HDL 30 (L) 11/03/2019 0905   HDL 34 (L) 09/09/2019 1414   CHOLHDL 5.4 11/03/2019 0905   VLDL 61 (H) 11/03/2019 0905   LDLCALC 72 11/03/2019 0905   LDLCALC 111 (H) 09/09/2019 1414      Wt Readings from Last 3 Encounters:  10/21/20 250 lb 2 oz (113.5 kg)  04/15/20 257 lb 4 oz (116.7 kg)  09/28/19 255 lb (115.7 kg)       ASSESSMENT AND PLAN:  1.  Coronary artery disease involving native coronary arteries without angina: He is doing very well overall. Continue medical therapy. Given multiple drug-eluting stents, I favor indefinite dual antiplatelet therapy.  2. Essential hypertension: Blood pressure is under excellent control on current medications.  Labs in April were unremarkable.  3. Hyperlipidemia: Continue treatment with atorvastatin 80 mg daily and Zetia.  Most recent lipid profile showed an LDL of 72.  4. Obesity: I had a prolonged discussion with him about the  importance of heart healthy diet, regular exercise and weight loss.  5.  Left nipple tenderness: Likely due to treatment with spironolactone.  I elected to switch him to eplerenone 25 mg once daily.  If symptoms persist, I asked him to follow-up with his primary care physician.   Disposition:   FU with me in  6 months  Signed,  May, MD  10/21/2020 4:09 PM    [facility]

## 2020-11-02 ENCOUNTER — Other Ambulatory Visit: Payer: Self-pay | Admitting: Cardiovascular Disease

## 2020-11-02 NOTE — Telephone Encounter (Signed)
Rx request sent to pharmacy.  

## 2020-11-07 ENCOUNTER — Other Ambulatory Visit: Payer: Self-pay | Admitting: *Deleted

## 2020-11-07 MED ORDER — HYDROCHLOROTHIAZIDE 25 MG PO TABS: 25.0000 mg | ORAL_TABLET | Freq: Every day | ORAL | 3 refills | Status: DC

## 2020-12-21 ENCOUNTER — Other Ambulatory Visit: Payer: Self-pay | Admitting: Cardiovascular Disease

## 2021-01-10 DIAGNOSIS — M9903 Segmental and somatic dysfunction of lumbar region: Secondary | ICD-10-CM | POA: Diagnosis not present

## 2021-01-10 DIAGNOSIS — M5416 Radiculopathy, lumbar region: Secondary | ICD-10-CM | POA: Diagnosis not present

## 2021-01-10 DIAGNOSIS — M62830 Muscle spasm of back: Secondary | ICD-10-CM | POA: Diagnosis not present

## 2021-01-10 DIAGNOSIS — M9905 Segmental and somatic dysfunction of pelvic region: Secondary | ICD-10-CM | POA: Diagnosis not present

## 2021-01-11 DIAGNOSIS — M5416 Radiculopathy, lumbar region: Secondary | ICD-10-CM | POA: Diagnosis not present

## 2021-01-11 DIAGNOSIS — M9905 Segmental and somatic dysfunction of pelvic region: Secondary | ICD-10-CM | POA: Diagnosis not present

## 2021-01-11 DIAGNOSIS — M62830 Muscle spasm of back: Secondary | ICD-10-CM | POA: Diagnosis not present

## 2021-01-11 DIAGNOSIS — M9903 Segmental and somatic dysfunction of lumbar region: Secondary | ICD-10-CM | POA: Diagnosis not present

## 2021-01-12 DIAGNOSIS — M9903 Segmental and somatic dysfunction of lumbar region: Secondary | ICD-10-CM | POA: Diagnosis not present

## 2021-01-12 DIAGNOSIS — M5416 Radiculopathy, lumbar region: Secondary | ICD-10-CM | POA: Diagnosis not present

## 2021-01-12 DIAGNOSIS — M62830 Muscle spasm of back: Secondary | ICD-10-CM | POA: Diagnosis not present

## 2021-01-12 DIAGNOSIS — M9905 Segmental and somatic dysfunction of pelvic region: Secondary | ICD-10-CM | POA: Diagnosis not present

## 2021-01-16 DIAGNOSIS — M9905 Segmental and somatic dysfunction of pelvic region: Secondary | ICD-10-CM | POA: Diagnosis not present

## 2021-01-16 DIAGNOSIS — M5416 Radiculopathy, lumbar region: Secondary | ICD-10-CM | POA: Diagnosis not present

## 2021-01-16 DIAGNOSIS — M62830 Muscle spasm of back: Secondary | ICD-10-CM | POA: Diagnosis not present

## 2021-01-16 DIAGNOSIS — M9903 Segmental and somatic dysfunction of lumbar region: Secondary | ICD-10-CM | POA: Diagnosis not present

## 2021-04-18 DIAGNOSIS — E7800 Pure hypercholesterolemia, unspecified: Secondary | ICD-10-CM | POA: Diagnosis not present

## 2021-04-18 DIAGNOSIS — Z79899 Other long term (current) drug therapy: Secondary | ICD-10-CM | POA: Diagnosis not present

## 2021-04-18 DIAGNOSIS — I2510 Atherosclerotic heart disease of native coronary artery without angina pectoris: Secondary | ICD-10-CM | POA: Diagnosis not present

## 2021-04-18 DIAGNOSIS — I10 Essential (primary) hypertension: Secondary | ICD-10-CM | POA: Diagnosis not present

## 2021-04-18 DIAGNOSIS — Z0000 Encounter for general adult medical examination without abnormal findings: Secondary | ICD-10-CM | POA: Diagnosis not present

## 2021-04-18 DIAGNOSIS — Z125 Encounter for screening for malignant neoplasm of prostate: Secondary | ICD-10-CM | POA: Diagnosis not present

## 2021-04-18 DIAGNOSIS — Z1331 Encounter for screening for depression: Secondary | ICD-10-CM | POA: Diagnosis not present

## 2021-05-08 ENCOUNTER — Ambulatory Visit: Payer: BC Managed Care – PPO | Admitting: Cardiovascular Disease

## 2021-05-12 ENCOUNTER — Other Ambulatory Visit: Payer: Self-pay

## 2021-05-12 ENCOUNTER — Ambulatory Visit (INDEPENDENT_AMBULATORY_CARE_PROVIDER_SITE_OTHER): Payer: BC Managed Care – PPO | Admitting: Cardiovascular Disease

## 2021-05-12 ENCOUNTER — Encounter: Payer: Self-pay | Admitting: Cardiovascular Disease

## 2021-05-12 VITALS — BP 130/90 | HR 82 | Ht 70.0 in | Wt 237.2 lb

## 2021-05-12 DIAGNOSIS — E785 Hyperlipidemia, unspecified: Secondary | ICD-10-CM

## 2021-05-12 DIAGNOSIS — I2510 Atherosclerotic heart disease of native coronary artery without angina pectoris: Secondary | ICD-10-CM

## 2021-05-12 DIAGNOSIS — I10 Essential (primary) hypertension: Secondary | ICD-10-CM

## 2021-05-12 DIAGNOSIS — Z720 Tobacco use: Secondary | ICD-10-CM

## 2021-05-12 MED ORDER — CLOPIDOGREL BISULFATE 75 MG PO TABS: 75.0000 mg | ORAL_TABLET | Freq: Every day | ORAL | 3 refills | Status: DC

## 2021-05-12 MED ORDER — EZETIMIBE 10 MG PO TABS: 10.0000 mg | ORAL_TABLET | Freq: Every day | ORAL | 3 refills | Status: DC

## 2021-05-12 NOTE — Patient Instructions (Signed)

## 2021-05-12 NOTE — Progress Notes (Signed)
Cardiology Office Note   Date:  05/12/2021   ID:  George Welch, DOB 01-08-1976, MRN [id number]  PCP:  Evie Lacks, NP (Inactive)  Cardiologist:   Lorine Bears, MD   Chief Complaint  Patient presents with  . 6 month follow up     "doing well." Medications reviewed by the patient verbally.       History of Present Illness: George Welch is a 45 y.o. male who presents for a follow-up visit regarding coronary artery disease and hypertension.  He has known history of hypertension, obesity, hyperlipidemia and marijuana use. He presented in September 2016 with non-ST elevation myocardial infarction . Cardiac catheterization showed severe two-vessel coronary artery disease involving mid left circumflex, distal and mid right coronary artery. OM 2 was also noted to be occluded with collaterals. He underwent successful angioplasty and drug-eluting stent placement to the left circumflex. He required 3 drug-eluting stents to the RCA. Echocardiogram showed normal LV systolic function with no significant valvular abnormalities. He had angioedema with lisinopril.   During last visit, spironolactone was switched to eplerenone due to nipple tenderness.  Symptoms improved since then.  He has been doing very well and denies chest pain or shortness of breath.  He ran out of clopidogrel and Zetia.  He continues to smoke less than a pack per day.  Past Medical History:  Diagnosis Date  . Coronary artery disease 08/2015   a. 08/2015 NSTEMI/PCI: Sev mLCX (DES) & m/d RCA (DES x 3) dzs. OM 2 100 w. collats.   . Diastolic dysfunction    a. 08/2015 Echo: EF 55-60%, Gr1 DD.   [name] abuse (HCC)    a. THC  . Hyperlipidemia   . Hypertension   . MI (myocardial infarction) (HCC)   . Obesity   . Stented coronary artery     Past Surgical History:  Procedure Laterality Date  . CARDIAC CATHETERIZATION N/A 08/26/2015   Procedure: Left Heart Cath and Coronary Angiography;  Surgeon: Antonieta Iba, MD;   Location: [facility] INVASIVE CV LAB;  Service: Cardiovascular;  Laterality: N/A;  . CARDIAC CATHETERIZATION N/A 08/26/2015   Procedure: Coronary Stent Intervention;  Surgeon: Alwyn Pea, MD;  Location: [facility] INVASIVE CV LAB;  Service: Cardiovascular;  Laterality: N/A;     Current Outpatient Medications  Medication Sig Dispense Refill  . amLODipine (NORVASC) 10 MG tablet Take 1 tablet by mouth once daily 30 tablet 4  . aspirin 81 MG chewable tablet Chew 1 tablet (81 mg total) by mouth daily. 30 tablet 3  . atorvastatin (LIPITOR) 80 MG tablet Take 1 tablet (80 mg total) by mouth daily. 30 tablet 11  . carvedilol (COREG) 25 MG tablet Take 1 tablet (25 mg total) by mouth 2 (two) times daily with a meal. Please call to schedule follow-up appointment for further refills. Thank you! 60 tablet 11  . eplerenone (INSPRA) 25 MG tablet Take 1 tablet (25 mg total) by mouth daily. 90 tablet 2  . hydrochlorothiazide (HYDRODIURIL) 25 MG tablet Take 1 tablet (25 mg total) by mouth daily. 90 tablet 3  . nitroGLYCERIN (NITROSTAT) 0.4 MG SL tablet Place 1 tablet (0.4 mg total) under the tongue every 5 (five) minutes as needed for chest pain. 25 tablet 3  . clopidogrel (PLAVIX) 75 MG tablet Take 1 tablet (75 mg total) by mouth daily. 90 tablet 3  . ezetimibe (ZETIA) 10 MG tablet Take 1 tablet (10 mg total) by mouth daily. 90 tablet 3   No  current facility-administered medications for this visit.    Allergies:   Lisinopril    Social History:  The patient  reports that he has been smoking cigarettes. He has been smoking about 0.50 packs per day. He has never used smokeless tobacco. He reports current alcohol use. He reports previous drug use.   Family History:  The patient's family history includes CAD in his father and mother; Hypertension in his father and mother; Stroke in his father.    ROS:  Please see the history of present illness.   Otherwise, review of systems are positive for none.   All other  systems are reviewed and negative.    PHYSICAL EXAM: VS:  BP 130/90 (BP Location: Left Arm, Patient Position: Sitting, Cuff Size: Large)   Pulse 82   Ht 5\' 10"  (1.778 m)   Wt 237 lb 4 oz (107.6 kg)   SpO2 98%   BMI 34.04 kg/m  , BMI Body mass index is 34.04 kg/m. GEN: Well nourished, well developed, in no acute distress  HEENT: normal  Neck: no JVD, carotid bruits, or masses Cardiac: RRR; no murmurs, rubs, or gallops,no edema  Respiratory:  clear to auscultation bilaterally, normal work of breathing GI: soft, nontender, nondistended, + BS MS: no deformity or atrophy  Skin: warm and dry, no rash Neuro:  Strength and sensation are intact Psych: euthymic mood, full affect   EKG:  EKG is ordered today. The ekg ordered today demonstrates normal sinus rhythm with no significant ST or T wave changes.  Old inferior infarct.   Recent Labs: No results found for requested labs within last 8760 hours.    Lipid Panel    Component Value Date/Time   CHOL 163 11/03/2019 0905   CHOL 171 09/09/2019 1414   TRIG 303 (H) 11/03/2019 0905   HDL 30 (L) 11/03/2019 0905   HDL 34 (L) 09/09/2019 1414   CHOLHDL 5.4 11/03/2019 0905   VLDL 61 (H) 11/03/2019 0905   LDLCALC 72 11/03/2019 0905   LDLCALC 111 (H) 09/09/2019 1414      Wt Readings from Last 3 Encounters:  05/12/21 237 lb 4 oz (107.6 kg)  10/21/20 250 lb 2 oz (113.5 kg)  04/15/20 257 lb 4 oz (116.7 kg)       ASSESSMENT AND PLAN:  1.  Coronary artery disease involving native coronary arteries without angina: He is doing very well overall. Continue medical therapy. Given multiple drug-eluting stents, I favor indefinite dual antiplatelet therapy.  2. Essential hypertension: Blood pressure is reasonably controlled on current medications.  I reviewed his recent labs which showed normal renal function and electrolytes.  3. Hyperlipidemia: Continue treatment with atorvastatin 80 mg daily.  He ran out of Zetia which was refilled  today.  I reviewed his recent lipid profile which showed an LDL of 117 which is likely due to not taking his medications regularly.  I stressed to him the importance of taking his medications.  4. Obesity: I had a prolonged discussion with him about the importance of heart healthy diet, regular exercise and weight loss.  5.  Tobacco use: He reports inability to quit at the present time.   Disposition:   FU with me in  6 months  Signed,  04/17/20, MD  05/12/2021 11:49 AM    [facility]

## 2021-06-13 ENCOUNTER — Other Ambulatory Visit: Payer: Self-pay

## 2021-06-13 MED ORDER — CARVEDILOL 25 MG PO TABS: 25.0000 mg | ORAL_TABLET | Freq: Two times a day (BID) | ORAL | 3 refills | Status: DC

## 2021-07-17 ENCOUNTER — Other Ambulatory Visit: Payer: Self-pay | Admitting: Cardiovascular Disease

## 2021-10-02 ENCOUNTER — Other Ambulatory Visit: Payer: Self-pay | Admitting: Cardiovascular Disease

## 2021-10-02 NOTE — Telephone Encounter (Signed)
Scheduled

## 2021-10-02 NOTE — Telephone Encounter (Signed)
Please schedule 6 month F/U for 90 day refill. Thank you! 

## 2021-11-08 ENCOUNTER — Other Ambulatory Visit: Payer: Self-pay | Admitting: Nurse Practitioner

## 2021-12-06 ENCOUNTER — Ambulatory Visit: Payer: BC Managed Care – PPO | Admitting: Cardiovascular Disease

## 2021-12-06 ENCOUNTER — Other Ambulatory Visit: Payer: Self-pay

## 2021-12-06 ENCOUNTER — Encounter: Payer: Self-pay | Admitting: Cardiovascular Disease

## 2021-12-06 VITALS — BP 110/78 | HR 78 | Ht 70.0 in | Wt 243.0 lb

## 2021-12-06 DIAGNOSIS — I2510 Atherosclerotic heart disease of native coronary artery without angina pectoris: Secondary | ICD-10-CM | POA: Diagnosis not present

## 2021-12-06 DIAGNOSIS — Z720 Tobacco use: Secondary | ICD-10-CM

## 2021-12-06 DIAGNOSIS — E785 Hyperlipidemia, unspecified: Secondary | ICD-10-CM | POA: Diagnosis not present

## 2021-12-06 DIAGNOSIS — I10 Essential (primary) hypertension: Secondary | ICD-10-CM | POA: Diagnosis not present

## 2021-12-06 NOTE — Progress Notes (Signed)
Cardiology Office Note   Date:  12/06/2021   ID:  DORRELL MITCHELTREE, DOB [birth year], MRN [id number]  PCP:  Evie Lacks, NP (Inactive)  Cardiologist:   Lorine Bears, MD   Chief Complaint  Patient presents with   Other    6 Month f/u no complaints today. Meds reviewed verbally with pt.      History of Present Illness: MYLIN HIRANO is a 45 y.o. male who presents for a follow-up visit regarding coronary artery disease and hypertension.  He has known history of hypertension, obesity, hyperlipidemia and marijuana use. He presented in September 2016 with non-ST elevation myocardial infarction . Cardiac catheterization showed severe two-vessel coronary artery disease involving mid left circumflex, distal and mid right coronary artery. OM 2 was also noted to be occluded with collaterals. He underwent successful angioplasty and drug-eluting stent placement to the left circumflex. He required 3 drug-eluting stents to the RCA. Echocardiogram showed normal LV systolic function with no significant valvular abnormalities. He had angioedema with lisinopril.  Spironolactone was switched to eplerenone due to nipple tenderness.  Symptoms improved since then.  He has been doing very well with no chest pain, shortness of breath or palpitations.  He continues to smoke less than 1 pack/day.   Past Medical History:  Diagnosis Date   Coronary artery disease 08/2015   a. 08/2015 NSTEMI/PCI: Sev mLCX (DES) & m/d RCA (DES x 3) dzs. OM 2 100 w. collats.    Diastolic dysfunction    a. 08/2015 Echo: EF 55-60%, Gr1 DD.    Drug abuse (HCC)    a. THC   Hyperlipidemia    Hypertension    MI (myocardial infarction) (HCC)    Obesity    Stented coronary artery     Past Surgical History:  Procedure Laterality Date   CARDIAC CATHETERIZATION N/A 08/26/2015   Procedure: Left Heart Cath and Coronary Angiography;  Surgeon: Antonieta Iba, MD;  Location: [facility] INVASIVE CV LAB;  Service: Cardiovascular;   Laterality: N/A;   CARDIAC CATHETERIZATION N/A 08/26/2015   Procedure: Coronary Stent Intervention;  Surgeon: Alwyn Pea, MD;  Location: [facility] INVASIVE CV LAB;  Service: Cardiovascular;  Laterality: N/A;     Current Outpatient Medications  Medication Sig Dispense Refill   amLODipine (NORVASC) 10 MG tablet Take 1 tablet (10 mg total) by mouth daily. PLEASE CALL OFFICE TO SCHEDULE FOLLOW UP APPOINTMENT. 30 tablet 3   aspirin 81 MG chewable tablet Chew 1 tablet (81 mg total) by mouth daily. 30 tablet 3   atorvastatin (LIPITOR) 80 MG tablet Take 1 tablet by mouth once daily 30 tablet 0   carvedilol (COREG) 25 MG tablet Take 1 tablet (25 mg total) by mouth 2 (two) times daily with a meal. 60 tablet 3   clopidogrel (PLAVIX) 75 MG tablet Take 1 tablet (75 mg total) by mouth daily. 90 tablet 3   eplerenone (INSPRA) 25 MG tablet Take 1 tablet by mouth once daily 90 tablet 0   ezetimibe (ZETIA) 10 MG tablet Take 1 tablet (10 mg total) by mouth daily. 90 tablet 3   hydrochlorothiazide (HYDRODIURIL) 25 MG tablet Take 1 tablet (25 mg total) by mouth daily. 90 tablet 3   nitroGLYCERIN (NITROSTAT) 0.4 MG SL tablet Place 1 tablet (0.4 mg total) under the tongue every 5 (five) minutes as needed for chest pain. 25 tablet 3   No current facility-administered medications for this visit.    Allergies:   Lisinopril    Social History:  The patient  reports that he has been smoking cigarettes. He has been smoking an average of .5 packs per day. He has never used smokeless tobacco. He reports current alcohol use. He reports that he does not currently use drugs.   Family History:  The patient's family history includes CAD in his father and mother; Hypertension in his father and mother; Stroke in his father.    ROS:  Please see the history of present illness.   Otherwise, review of systems are positive for none.   All other systems are reviewed and negative.    PHYSICAL EXAM: VS:  BP 110/78 (BP Location:  Left Arm, Patient Position: Sitting, Cuff Size: Large)    Pulse 78    Ht 5\' 10"  (1.778 m)    Wt 243 lb (110.2 kg)    SpO2 96%    BMI 34.87 kg/m  , BMI Body mass index is 34.87 kg/m. GEN: Well nourished, well developed, in no acute distress  HEENT: normal  Neck: no JVD, carotid bruits, or masses Cardiac: RRR; no murmurs, rubs, or gallops,no edema  Respiratory:  clear to auscultation bilaterally, normal work of breathing GI: soft, nontender, nondistended, + BS MS: no deformity or atrophy  Skin: warm and dry, no rash Neuro:  Strength and sensation are intact Psych: euthymic mood, full affect   EKG:  EKG is ordered today. The ekg ordered today demonstrates normal sinus rhythm with no significant ST or T wave changes.  Recent Labs: No results found for requested labs within last 8760 hours.    Lipid Panel    Component Value Date/Time   CHOL 163 11/03/2019 0905   CHOL 171 09/09/2019 1414   TRIG 303 (H) 11/03/2019 0905   HDL 30 (L) 11/03/2019 0905   HDL 34 (L) 09/09/2019 1414   CHOLHDL 5.4 11/03/2019 0905   VLDL 61 (H) 11/03/2019 0905   LDLCALC 72 11/03/2019 0905   LDLCALC 111 (H) 09/09/2019 1414      Wt Readings from Last 3 Encounters:  12/06/21 243 lb (110.2 kg)  05/12/21 237 lb 4 oz (107.6 kg)  10/21/20 250 lb 2 oz (113.5 kg)       ASSESSMENT AND PLAN:  1.  Coronary artery disease involving native coronary arteries without angina: He is doing very well overall. Continue medical therapy. Given multiple drug-eluting stents, I favor indefinite dual antiplatelet therapy.   2. Essential hypertension: Blood pressures well controlled on current medications.  I requested basic metabolic profile given that he is on eplerenone and hydrochlorothiazide.   3. Hyperlipidemia: He is on high-dose atorvastatin and Zetia.  Most recent lipid profile showed an LDL of 117 but he was not taking his medications consistently.  I requested repeat lipid and liver profile today.  4.  Tobacco  use: He reports inability to quit at the present time.   Disposition:   FU with me in  6 months  Signed,  13/05/21, MD  12/06/2021 1:19 PM    [facility]

## 2021-12-06 NOTE — Patient Instructions (Signed)
Medication Instructions:  Your physician recommends that you continue on your current medications as directed. Please refer to the Current Medication list given to you today.  *If you need a refill on your cardiac medications before your next appointment, please call your pharmacy*   Lab Work: Lipid and Cmp today  If you have labs (blood work) drawn today and your tests are completely normal, you will receive your results only by: MyChart Message (if you have MyChart) OR A paper copy in the mail If you have any lab test that is abnormal or we need to change your treatment, we will call you to review the results.   Testing/Procedures: None ordered   Follow-Up: At [facility], you and your health needs are our priority.  As part of our continuing mission to provide you with exceptional heart care, we have created designated Provider Care Teams.  These Care Teams include your primary Cardiologist (physician) and Advanced Practice Providers (APPs -  Physician Assistants and Nurse Practitioners) who all work together to provide you with the care you need, when you need it.  We recommend signing up for the patient portal called "MyChart".  Sign up information is provided on this After Visit Summary.  MyChart is used to connect with patients for Virtual Visits (Telemedicine).  Patients are able to view lab/test results, encounter notes, upcoming appointments, etc.  Non-urgent messages can be sent to your provider as well.   To learn more about what you can do with MyChart, go to [email or web address].    Your next appointment:   6 month(s)  The format for your next appointment:   In Person  Provider:   You may see Lorine Bears, MD or one of the following Advanced Practice Providers on your designated Care Team:   Nicolasa Ducking, NP Eula Listen, PA-C    Other Instructions N/A

## 2021-12-07 LAB — COMPREHENSIVE METABOLIC PANEL
ALT: 43 IU/L (ref 0–44)
AST: 28 IU/L (ref 0–40)
Albumin/Globulin Ratio: 1.9 (ref 1.2–2.2)
Albumin: 4.9 g/dL (ref 4.0–5.0)
Alkaline Phosphatase: 75 IU/L (ref 44–121)
BUN/Creatinine Ratio: 11 (ref 9–20)
BUN: 11 mg/dL (ref 6–24)
Bilirubin Total: 0.4 mg/dL (ref 0.0–1.2)
CO2: 23 mmol/L (ref 20–29)
Calcium: 9.6 mg/dL (ref 8.7–10.2)
Chloride: 102 mmol/L (ref 96–106)
Creatinine, Ser: 1.02 mg/dL (ref 0.76–1.27)
Globulin, Total: 2.6 g/dL (ref 1.5–4.5)
Glucose: 96 mg/dL (ref 70–99)
Potassium: 4.3 mmol/L (ref 3.5–5.2)
Sodium: 137 mmol/L (ref 134–144)
Total Protein: 7.5 g/dL (ref 6.0–8.5)
eGFR: 92 mL/min/{1.73_m2} (ref >59)

## 2021-12-07 LAB — LIPID PANEL
Chol/HDL Ratio: 4.4 ratio (ref 0.0–5.0)
Cholesterol, Total: 144 mg/dL (ref 100–199)
HDL: 33 mg/dL — ABNORMAL LOW (ref >39)
LDL Chol Calc (NIH): 91 mg/dL (ref 0–99)
Triglycerides: 111 mg/dL (ref 0–149)
VLDL Cholesterol Cal: 20 mg/dL (ref 5–40)

## 2021-12-21 ENCOUNTER — Other Ambulatory Visit: Payer: Self-pay | Admitting: Cardiovascular Disease

## 2021-12-21 ENCOUNTER — Other Ambulatory Visit: Payer: Self-pay | Admitting: Nurse Practitioner

## 2022-02-13 ENCOUNTER — Other Ambulatory Visit: Payer: Self-pay | Admitting: Nurse Practitioner

## 2022-02-14 NOTE — Telephone Encounter (Signed)
This is a [place] pt 

## 2022-02-22 ENCOUNTER — Other Ambulatory Visit: Payer: Self-pay | Admitting: Cardiovascular Disease

## 2022-04-19 ENCOUNTER — Other Ambulatory Visit: Payer: Self-pay | Admitting: Nurse Practitioner

## 2022-04-19 ENCOUNTER — Other Ambulatory Visit: Payer: Self-pay | Admitting: Cardiovascular Disease

## 2022-04-19 DIAGNOSIS — Z125 Encounter for screening for malignant neoplasm of prostate: Secondary | ICD-10-CM | POA: Diagnosis not present

## 2022-04-19 DIAGNOSIS — E7800 Pure hypercholesterolemia, unspecified: Secondary | ICD-10-CM | POA: Diagnosis not present

## 2022-04-19 DIAGNOSIS — Z79899 Other long term (current) drug therapy: Secondary | ICD-10-CM | POA: Diagnosis not present

## 2022-04-19 DIAGNOSIS — Z0000 Encounter for general adult medical examination without abnormal findings: Secondary | ICD-10-CM | POA: Diagnosis not present

## 2022-04-19 DIAGNOSIS — Z1331 Encounter for screening for depression: Secondary | ICD-10-CM | POA: Diagnosis not present

## 2022-04-19 DIAGNOSIS — I10 Essential (primary) hypertension: Secondary | ICD-10-CM | POA: Diagnosis not present

## 2022-04-19 DIAGNOSIS — I2510 Atherosclerotic heart disease of native coronary artery without angina pectoris: Secondary | ICD-10-CM | POA: Diagnosis not present

## 2022-05-31 ENCOUNTER — Other Ambulatory Visit: Payer: Self-pay

## 2022-05-31 MED ORDER — EPLERENONE 25 MG PO TABS: 25.0000 mg | ORAL_TABLET | Freq: Every day | ORAL | 0 refills | Status: DC

## 2022-05-31 MED ORDER — HYDROCHLOROTHIAZIDE 25 MG PO TABS: 25.0000 mg | ORAL_TABLET | Freq: Every day | ORAL | 0 refills | Status: DC

## 2022-05-31 MED ORDER — ATORVASTATIN CALCIUM 80 MG PO TABS: 80.0000 mg | ORAL_TABLET | Freq: Every day | ORAL | 0 refills | Status: DC

## 2022-06-06 ENCOUNTER — Encounter: Payer: Self-pay | Admitting: Cardiovascular Disease

## 2022-06-06 ENCOUNTER — Ambulatory Visit: Payer: BC Managed Care – PPO | Admitting: Cardiovascular Disease

## 2022-06-06 VITALS — BP 110/80 | HR 77 | Ht 70.0 in | Wt 236.5 lb

## 2022-06-06 DIAGNOSIS — I2510 Atherosclerotic heart disease of native coronary artery without angina pectoris: Secondary | ICD-10-CM

## 2022-06-06 DIAGNOSIS — Z720 Tobacco use: Secondary | ICD-10-CM | POA: Diagnosis not present

## 2022-06-06 DIAGNOSIS — I10 Essential (primary) hypertension: Secondary | ICD-10-CM

## 2022-06-06 DIAGNOSIS — E785 Hyperlipidemia, unspecified: Secondary | ICD-10-CM

## 2022-06-06 MED ORDER — CLOPIDOGREL BISULFATE 75 MG PO TABS: 75.0000 mg | ORAL_TABLET | Freq: Every day | ORAL | 2 refills | Status: AC

## 2022-06-06 MED ORDER — HYDROCHLOROTHIAZIDE 25 MG PO TABS: 25.0000 mg | ORAL_TABLET | Freq: Every day | ORAL | 2 refills | Status: DC

## 2022-06-06 MED ORDER — EZETIMIBE 10 MG PO TABS: 10.0000 mg | ORAL_TABLET | Freq: Every day | ORAL | 2 refills | Status: AC

## 2022-06-06 MED ORDER — AMLODIPINE BESYLATE 10 MG PO TABS: ORAL_TABLET | ORAL | 2 refills | Status: AC

## 2022-06-06 MED ORDER — CARVEDILOL 25 MG PO TABS
25.0000 mg | ORAL_TABLET | Freq: Two times a day (BID) | ORAL | 2 refills | Status: AC
Start: 2022-06-06 — End: ?

## 2022-06-06 MED ORDER — ATORVASTATIN CALCIUM 80 MG PO TABS: 80.0000 mg | ORAL_TABLET | Freq: Every day | ORAL | 0 refills | Status: DC

## 2022-06-06 MED ORDER — NITROGLYCERIN 0.4 MG SL SUBL: 0.4000 mg | SUBLINGUAL_TABLET | SUBLINGUAL | 0 refills | Status: AC | PRN

## 2022-06-06 NOTE — Patient Instructions (Signed)

## 2022-06-06 NOTE — Progress Notes (Signed)
Cardiology Office Note   Date:  06/06/2022   ID:  George Welch, DOB 08-Dec-1976, MRN [id number]  PCP:  Evie Lacks, NP (Inactive)  Cardiologist:   Lorine Bears, MD   Chief Complaint  Patient presents with   Other    6 month f/u no complaints today. Meds reviewed verbally with pt.      History of Present Illness: George Welch is a 46 y.o. male who presents for a follow-up visit regarding coronary artery disease and hypertension.  He has known history of hypertension, obesity, hyperlipidemia and marijuana use. He presented in September 2016 with non-ST elevation myocardial infarction . Cardiac catheterization showed severe two-vessel coronary artery disease involving mid left circumflex, distal and mid right coronary artery. OM 2 was also noted to be occluded with collaterals. He underwent successful angioplasty and drug-eluting stent placement to the left circumflex. He required 3 drug-eluting stents to the RCA. Echocardiogram showed normal LV systolic function with no significant valvular abnormalities. He had angioedema with lisinopril.  Spironolactone was switched to eplerenone due to nipple tenderness.  Symptoms improved since then.  He has been doing well from a cardiac standpoint with no chest pain, shortness of breath or palpitations.  He admits that he does not take his cholesterol medications frequently especially recently.  In addition, he continues to smoke.  He does not exercise on a regular basis.   Past Medical History:  Diagnosis Date   Coronary artery disease 08/2015   a. 08/2015 NSTEMI/PCI: Sev mLCX (DES) & m/d RCA (DES x 3) dzs. OM 2 100 w. collats.    Diastolic dysfunction    a. 08/2015 Echo: EF 55-60%, Gr1 DD.    Drug abuse (HCC)    a. THC   Hyperlipidemia    Hypertension    MI (myocardial infarction) (HCC)    Obesity    Stented coronary artery     Past Surgical History:  Procedure Laterality Date   CARDIAC CATHETERIZATION N/A 08/26/2015    Procedure: Left Heart Cath and Coronary Angiography;  Surgeon: Antonieta Iba, MD;  Location: [facility] INVASIVE CV LAB;  Service: Cardiovascular;  Laterality: N/A;   CARDIAC CATHETERIZATION N/A 08/26/2015   Procedure: Coronary Stent Intervention;  Surgeon: Alwyn Pea, MD;  Location: [facility] INVASIVE CV LAB;  Service: Cardiovascular;  Laterality: N/A;     Current Outpatient Medications  Medication Sig Dispense Refill   aspirin 81 MG chewable tablet Chew 1 tablet (81 mg total) by mouth daily. 30 tablet 3   eplerenone (INSPRA) 25 MG tablet Take 1 tablet (25 mg total) by mouth daily. 30 tablet 0   amLODipine (NORVASC) 10 MG tablet TAKE 1 TABLET BY MOUTH ONCE DAILY 90 tablet 2   atorvastatin (LIPITOR) 80 MG tablet Take 1 tablet (80 mg total) by mouth daily. 30 tablet 0   carvedilol (COREG) 25 MG tablet Take 1 tablet (25 mg total) by mouth 2 (two) times daily with a meal. 180 tablet 2   clopidogrel (PLAVIX) 75 MG tablet Take 1 tablet (75 mg total) by mouth daily. 90 tablet 2   ezetimibe (ZETIA) 10 MG tablet Take 1 tablet (10 mg total) by mouth daily. 90 tablet 2   hydrochlorothiazide (HYDRODIURIL) 25 MG tablet Take 1 tablet (25 mg total) by mouth daily. 90 tablet 2   nitroGLYCERIN (NITROSTAT) 0.4 MG SL tablet Place 1 tablet (0.4 mg total) under the tongue every 5 (five) minutes as needed for chest pain. 25 tablet 0   No current  facility-administered medications for this visit.    Allergies:   Lisinopril    Social History:  The patient  reports that he has been smoking cigarettes. He has been smoking an average of .5 packs per day. He has never used smokeless tobacco. He reports current alcohol use. He reports that he does not currently use drugs.   Family History:  The patient's family history includes CAD in his father and mother; Hypertension in his father and mother; Stroke in his father.    ROS:  Please see the history of present illness.   Otherwise, review of systems are positive for  none.   All other systems are reviewed and negative.    PHYSICAL EXAM: VS:  BP 110/80 (BP Location: Left Arm, Patient Position: Sitting, Cuff Size: Large)   Pulse 77   Ht 5\' 10"  (1.778 m)   Wt 236 lb 8 oz (107.3 kg)   SpO2 98%   BMI 33.93 kg/m  , BMI Body mass index is 33.93 kg/m. GEN: Well nourished, well developed, in no acute distress  HEENT: normal  Neck: no JVD, carotid bruits, or masses Cardiac: RRR; no murmurs, rubs, or gallops,no edema  Respiratory:  clear to auscultation bilaterally, normal work of breathing GI: soft, nontender, nondistended, + BS MS: no deformity or atrophy  Skin: warm and dry, no rash Neuro:  Strength and sensation are intact Psych: euthymic mood, full affect   EKG:  EKG is ordered today. The ekg ordered today demonstrates normal sinus rhythm with no significant ST or T wave changes.  Recent Labs: 12/06/2021: ALT 43; BUN 11; Creatinine, Ser 1.02; Potassium 4.3; Sodium 137    Lipid Panel    Component Value Date/Time   CHOL 144 12/06/2021 1036   TRIG 111 12/06/2021 1036   HDL 33 (L) 12/06/2021 1036   CHOLHDL 4.4 12/06/2021 1036   CHOLHDL 5.4 11/03/2019 0905   VLDL 61 (H) 11/03/2019 0905   LDLCALC 91 12/06/2021 1036      Wt Readings from Last 3 Encounters:  06/06/22 236 lb 8 oz (107.3 kg)  12/06/21 243 lb (110.2 kg)  05/12/21 237 lb 4 oz (107.6 kg)       ASSESSMENT AND PLAN:  1.  Coronary artery disease involving native coronary arteries without angina: He is doing  well overall. Continue medical therapy. Given multiple drug-eluting stents, I favor indefinite dual antiplatelet therapy.   2. Essential hypertension: Blood pressure is well controlled on current medications.  I refilled his antihypertensive medications.   3. Hyperlipidemia: Lipid profile in December of last year showed an LDL of 91.  Recent lipid profile with his primary care physician showed an LDL of 167.  He does admit that he was not taking atorvastatin and Zetia at  that time.  I discussed with him the importance of compliance as well as importance of healthy diet and exercise.  4.  Tobacco use: I discussed the importance of smoking cessation.  He reports inability to quit at the present time.  Overall, I discussed with him my concerns about his and healthy lifestyle and uncontrolled risk factors.  He is at high risk for recurrent cardiovascular events.   Disposition:   FU with me in  6 months  Signed,  January, MD  06/06/2022 4:01 PM    [facility]

## 2022-06-18 ENCOUNTER — Other Ambulatory Visit: Payer: Self-pay

## 2022-06-18 MED ORDER — EPLERENONE 25 MG PO TABS: 25.0000 mg | ORAL_TABLET | Freq: Every day | ORAL | 1 refills | Status: DC

## 2022-09-08 ENCOUNTER — Other Ambulatory Visit: Payer: Self-pay | Admitting: Cardiovascular Disease

## 2022-12-12 ENCOUNTER — Ambulatory Visit: Payer: BC Managed Care – PPO | Admitting: Cardiovascular Disease

## 2023-01-16 ENCOUNTER — Ambulatory Visit: Payer: BC Managed Care – PPO | Attending: Cardiovascular Disease | Admitting: Nurse Practitioner

## 2023-01-16 ENCOUNTER — Encounter: Payer: Self-pay | Admitting: Nurse Practitioner

## 2023-01-16 ENCOUNTER — Other Ambulatory Visit
Admission: RE | Admit: 2023-01-16 | Discharge: 2023-01-16 | Disposition: A | Discharge status: Home / Self Care | Payer: BC Managed Care – PPO | Source: Ambulatory Visit | Attending: Nurse Practitioner | Admitting: Nurse Practitioner

## 2023-01-16 VITALS — BP 126/82 | HR 70 | Ht 70.0 in | Wt 235.8 lb

## 2023-01-16 DIAGNOSIS — I2510 Atherosclerotic heart disease of native coronary artery without angina pectoris: Secondary | ICD-10-CM

## 2023-01-16 DIAGNOSIS — Z720 Tobacco use: Secondary | ICD-10-CM | POA: Diagnosis not present

## 2023-01-16 DIAGNOSIS — E785 Hyperlipidemia, unspecified: Secondary | ICD-10-CM

## 2023-01-16 DIAGNOSIS — I10 Essential (primary) hypertension: Secondary | ICD-10-CM | POA: Diagnosis not present

## 2023-01-16 LAB — LIPID PANEL
Cholesterol: 112 mg/dL (ref 0–200)
HDL: 31 mg/dL — ABNORMAL LOW (ref >40)
LDL Cholesterol: 64 mg/dL (ref 0–99)
Total CHOL/HDL Ratio: 3.6 RATIO
Triglycerides: 83 mg/dL (ref <150)
VLDL: 17 mg/dL (ref 0–40)

## 2023-01-16 LAB — COMPREHENSIVE METABOLIC PANEL
ALT: 41 U/L (ref 0–44)
AST: 31 U/L (ref 15–41)
Albumin: 4.2 g/dL (ref 3.5–5.0)
Alkaline Phosphatase: 58 U/L (ref 38–126)
Anion gap: 11 (ref 5–15)
BUN: 10 mg/dL (ref 6–20)
CO2: 24 mmol/L (ref 22–32)
Calcium: 9.4 mg/dL (ref 8.9–10.3)
Chloride: 103 mmol/L (ref 98–111)
Creatinine, Ser: 0.86 mg/dL (ref 0.61–1.24)
GFR, Estimated: >60 mL/min (ref >60)
Glucose, Bld: 96 mg/dL (ref 70–99)
Potassium: 3.8 mmol/L (ref 3.5–5.1)
Sodium: 138 mmol/L (ref 135–145)
Total Bilirubin: 0.7 mg/dL (ref 0.3–1.2)
Total Protein: 8 g/dL (ref 6.5–8.1)

## 2023-01-16 NOTE — Progress Notes (Signed)
Office Visit    Patient Name: George Welch Date of Encounter: 01/16/2023  Primary Care Provider:  Laneta Simmers, NP (Inactive) Primary Cardiologist:  George Sacramento, MD  Chief Complaint    47 year old male with a history of CAD, diastolic dysfunction, hypertension, hyperlipidemia, and tobacco/marijuana use, who presents for CAD follow-up.  Past Medical History    Past Medical History:  Diagnosis Date   Coronary artery disease 08/2015   a. 08/2015 NSTEMI/PCI: Sev mLCX (DES) & m/d RCA (DES x 3) dzs. OM 2 100 w. collats.    Diastolic dysfunction    a. 08/2015 Echo: EF 55-60%, Gr1 DD.    Drug abuse ([place])    a. THC   Hyperlipidemia    Hypertension    MI (myocardial infarction) ([place])    Obesity    Stented coronary artery    Past Surgical History:  Procedure Laterality Date   CARDIAC CATHETERIZATION N/A 08/26/2015   Procedure: Left Heart Cath and Coronary Angiography;  Surgeon: George Merritts, MD;  Location: [facility];  Service: Cardiovascular;  Laterality: N/A;   CARDIAC CATHETERIZATION N/A 08/26/2015   Procedure: Coronary Stent Intervention;  Surgeon: George Kida, MD;  Location: [place] [facility];  Service: Cardiovascular;  Laterality: N/A;    Allergies  Allergies  Allergen Reactions   Lisinopril Swelling    Angioedema Around lips     History of Present Illness    47 year old male with above past medical history including CAD status post non-STEMI in September 2016 with PCI/drug-eluting stent placement to the left circumflex (DES x 1) and RCA (DES x 3).  The second obtuse marginal was noted to be occluded and fills via collaterals.  Echo showed normal LV function.  Other history includes hypertension, hyperlipidemia, diastolic dysfunction, tobacco/marijuana use, and obesity.  He experienced angioedema on lisinopril and this was discontinued.  Spironolactone was switched to eplerenone due to nipple tenderness with improvement in symptoms.  [name] was last seen in cardiology clinic in June 2023, at which time he was doing well.  LDL prior to visit was elevated 167 and patient admitted that he was not taking atorvastatin or Zetia, and he was advised to resume, which he has done.  He has since felt well.  He denies chest pain, palpitations, dyspnea, pnd, orthopnea, n, v, dizziness, syncope, edema, weight gain, or early satiety.  He cont to smoke 1 pack of cigarettes daily and occasionally uses marijuana.  He is not currently contemplating quitting.  He is fasting this morning and willing to have repeat lipids.  Home Medications    Current Outpatient Medications  Medication Sig Dispense Refill   amLODipine (NORVASC) 10 MG tablet TAKE 1 TABLET BY MOUTH ONCE DAILY 90 tablet 2   aspirin 81 MG chewable tablet Chew 1 tablet (81 mg total) by mouth daily. 30 tablet 3   atorvastatin (LIPITOR) 80 MG tablet TAKE 1 TABLET DAILY 30 tablet 2   carvedilol (COREG) 25 MG tablet Take 1 tablet (25 mg total) by mouth 2 (two) times daily with a meal. 180 tablet 2   clopidogrel (PLAVIX) 75 MG tablet Take 1 tablet (75 mg total) by mouth daily. 90 tablet 2   eplerenone (INSPRA) 25 MG tablet Take 1 tablet (25 mg total) by mouth daily. 90 tablet 1   ezetimibe (ZETIA) 10 MG tablet Take 1 tablet (10 mg total) by mouth daily. 90 tablet 2   hydrochlorothiazide (HYDRODIURIL) 25 MG tablet Take 1 tablet (25 mg  total) by mouth daily. 90 tablet 2   nitroGLYCERIN (NITROSTAT) 0.4 MG SL tablet Place 1 tablet (0.4 mg total) under the tongue every 5 (five) minutes as needed for chest pain. 25 tablet 0   No current facility-administered medications for this visit.     Review of Systems    He denies chest pain, palpitations, dyspnea, pnd, orthopnea, n, v, dizziness, syncope, edema, weight gain, or early satiety.  All other systems reviewed and are otherwise negative except as noted above.    Physical Exam    VS:  BP 126/82 (BP Location: Left Arm, Patient Position:  Sitting, Cuff Size: Large)   Pulse 70   Ht 5\' 10"  (1.778 m)   Wt 235 lb 12.8 oz (107 kg)   SpO2 98%   BMI 33.83 kg/m  , BMI Body mass index is 33.83 kg/m.     GEN: Well nourished, well developed, in no acute distress. HEENT: normal. Neck: Supple, no JVD, carotid bruits, or masses. Cardiac: RRR, no murmurs, rubs, or gallops. No clubbing, cyanosis, edema.  Radials 2+/PT 2+ and equal bilaterally.  Respiratory:  Respirations regular and unlabored, clear to auscultation bilaterally. GI: Soft, nontender, nondistended, BS + x 4. MS: no deformity or atrophy. Skin: warm and dry, no rash. Neuro:  Strength and sensation are intact. Psych: Normal affect.  Accessory Clinical Findings    ECG personally reviewed by me today -regular sinus rhythm, 70, nonspecific T changes- no acute changes.  Labs dated Apr 19, 2022 from care everywhere:  Hemoglobin 15.1, hematocrit 46.8, WBC 3.3, platelets 193 Sodium 137, potassium 4.3, chloride 107, CO2 22.5, BUN 13, creatinine 0.9, glucose 90 Total bilirubin 0.4, alkaline phosphatase 64, AST 19, ALT 18 Total protein 7.3, albumin 4.3, calcium 9.7 Total cholesterol 235, triglycerides 146, HDL 39.2, LDL 167 PSA 1.0  Assessment & Plan    1.  Coronary artery disease: Status post non-STEMI and in September 2016 with PCI and drug-eluting stent placement to the left circumflex (x1) and RCA (x3).  He continues to do well without chest pain or dyspnea.  He remains reasonably active.  He remains on lifelong dual antiplatelet therapy, beta-blocker, and following his last visit, he did resume statin/Zetia therapy.  2.  Essential hypertension: Stable on current regimen.  Follow-up basic metabolic panel in the setting of ongoing HCTZ and eplerenone use.  3.  Hyperlipidemia: LDL of 167 while off of statin and Zetia in May 2023.  He has since been taking.  We discussed today and as he is fasting, I will arrange for follow-up lipids and LFTs.  If LDL not at goal, he is open  to the idea of using an injectable agent.  4.  Tobacco marijuana abuse: Continues smoke 1 pack of cigarettes daily and occasional marijuana.  Complete cessation advised.  Offered to assist with nicotine replacement therapy and Chantix/Wellbutrin.  He is in the precontemplation stage and will let us know if he changes his mind.  5.  Disposition: Follow-up complete metabolic panel and lipids today.  Follow-up in clinic in 6 months or sooner if necessary.   Murray Hodgkins, NP 01/16/2023, 10:54 AM

## 2023-01-16 NOTE — Patient Instructions (Signed)
Medication Instructions:  Your physician recommends that you continue on your current medications as directed. Please refer to the Current Medication list given to you today.  *If you need a refill on your cardiac medications before your next appointment, please call your pharmacy*   Lab Work: Your physician recommends that you get lab work: [place] [facility] at [facility] 1st desk on the right to check in (REGISTRATION)  Lab hours: Monday- Friday (7:30 am- 5:30 pm)  If you have labs (blood work) drawn today and your tests are completely normal, you will receive your results only by: MyChart Message (if you have MyChart) OR A paper copy in the mail If you have any lab test that is abnormal or we need to change your treatment, we will call you to review the results.   Testing/Procedures: -None   Follow-Up: At [facility], you and your health needs are our priority.  As part of our continuing mission to provide you with exceptional heart care, we have created designated Provider Care Teams.  These Care Teams include your primary Cardiologist (physician) and Advanced Practice Providers (APPs -  Physician Assistants and Nurse Practitioners) who all work together to provide you with the care you need, when you need it.  We recommend signing up for the patient portal called "MyChart".  Sign up information is provided on this After Visit Summary.  MyChart is used to connect with patients for Virtual Visits (Telemedicine).  Patients are able to view lab/test results, encounter notes, upcoming appointments, etc.  Non-urgent messages can be sent to your provider as well.   To learn more about what you can do with MyChart, go to [email or web address].    Your next appointment:   6 month(s)  Provider:   You may see Kathlyn Sacramento, MD or one of the following Advanced Practice Providers on your designated Care Team:   Murray Hodgkins, NP  Other Instructions -None

## 2023-01-17 ENCOUNTER — Encounter: Payer: Self-pay | Admitting: *Deleted

## 2023-01-25 NOTE — Addendum Note (Signed)
Addended by: James Ivanoff D on: 01/25/2023 09:11 AM   Modules accepted: Orders

## 2023-02-01 ENCOUNTER — Other Ambulatory Visit: Payer: Self-pay | Admitting: Cardiovascular Disease

## 2023-06-11 ENCOUNTER — Other Ambulatory Visit: Payer: Self-pay | Admitting: Cardiovascular Disease

## 2023-06-28 ENCOUNTER — Other Ambulatory Visit: Payer: Self-pay | Admitting: Cardiovascular Disease

## 2023-06-28 NOTE — Telephone Encounter (Signed)
Please schedule 6 month F/U appointment for further refills. Thank you! 

## 2023-07-28 ENCOUNTER — Emergency Department
Admission: EM | Admit: 2023-07-28 | Discharge: 2023-07-28 | Disposition: A | Discharge status: Home / Self Care | Payer: BC Managed Care – PPO | Attending: Emergency Medicine | Admitting: Emergency Medicine

## 2023-07-28 DIAGNOSIS — M545 Low back pain, unspecified: Secondary | ICD-10-CM | POA: Diagnosis not present

## 2023-07-28 DIAGNOSIS — G8929 Other chronic pain: Secondary | ICD-10-CM | POA: Insufficient documentation

## 2023-07-28 DIAGNOSIS — M5459 Other low back pain: Secondary | ICD-10-CM | POA: Diagnosis not present

## 2023-07-28 MED ORDER — KETOROLAC TROMETHAMINE 10 MG PO TABS: 10.0000 mg | ORAL_TABLET | Freq: Three times a day (TID) | ORAL | 0 refills | Status: AC | PRN

## 2023-07-28 MED ORDER — DEXAMETHASONE SODIUM PHOSPHATE 10 MG/ML IJ SOLN
10.0000 mg | Freq: Once | INTRAMUSCULAR | Status: AC
  Administered 2023-07-28: 10 mg via INTRAMUSCULAR
  Filled 2023-07-28: qty 1

## 2023-07-28 MED ORDER — LIDOCAINE 5 % EX PTCH
1.0000 | MEDICATED_PATCH | CUTANEOUS | Status: DC
  Administered 2023-07-28: 1 via TRANSDERMAL
  Filled 2023-07-28 (×2): qty 1

## 2023-07-28 MED ORDER — CYCLOBENZAPRINE HCL 10 MG PO TABS
5.0000 mg | ORAL_TABLET | Freq: Once | ORAL | Status: AC
  Administered 2023-07-28: 5 mg via ORAL
  Filled 2023-07-28: qty 1

## 2023-07-28 MED ORDER — CYCLOBENZAPRINE HCL 10 MG PO TABS: 10.0000 mg | ORAL_TABLET | Freq: Three times a day (TID) | ORAL | 0 refills | Status: AC | PRN

## 2023-07-28 MED ORDER — KETOROLAC TROMETHAMINE 30 MG/ML IJ SOLN
30.0000 mg | Freq: Once | INTRAMUSCULAR | Status: AC
  Administered 2023-07-28: 30 mg via INTRAMUSCULAR
  Filled 2023-07-28: qty 1

## 2023-07-28 NOTE — ED Triage Notes (Signed)
Pt co of lower back pain x 8 days. Pt states he was outside spraying in the yard when he felt his back wrong. Pt co of pain radiating down left leg.   Pt states he took [employer] back pain relief medication at 2pm today.

## 2023-07-28 NOTE — ED Provider Notes (Signed)
[facility] Emergency Department Provider Note     Event Date/Time   First MD Initiated Contact with Patient 07/28/23 1658     (approximate)   History   Back Pain   HPI  George Welch is a 47 y.o. male presents to the ED with complaint of lower back pain x 8 days.  Denies trauma.  Patient reports he " pulled it wrong" and now the pain is radiating down his left leg.  Patient reports chronic back pain.  This pain is worse.  Denies injury or trauma, fever, loss of bladder and bowel control, numbness and urinary symptoms.    Physical Exam   Triage Vital Signs: ED Triage Vitals [07/28/23 1647]  Encounter Vitals Group     BP (!) 162/89     Systolic BP Percentile      Diastolic BP Percentile      Pulse Rate 75     Resp 17     Temp 98.2 F (36.8 C)     Temp Source Oral     SpO2 97 %     Weight      Height      Head Circumference      Peak Flow      Pain Score      Pain Loc      Pain Education      Exclude from Growth Chart     Most recent vital signs: Vitals:   07/28/23 1647  BP: (!) 162/89  Pulse: 75  Resp: 17  Temp: 98.2 F (36.8 C)  SpO2: 97%    General: Alert and oriented. INAD. Blunt. Flat affect.  Skin:  No rashes or lesions noted.     Head:  NCAT.  Eyes:  PERRLA. EOMI.  CV:  Good peripheral perfusion. RRR.  RESP:  Normal effort. LCTAB. No retractions.  ABD:  No distention. Soft, Non tender. No masses or organomegaly.  No CVA tenderness BACK:  Spinous process is midline without deformity or tenderness. TTP over bilateral lumbar muscles. Negative SLRT bilaterally.  MSK:   Full ROM in all joints. No swelling, deformity or tenderness.  NEURO: Cranial nerves II-XII intact. No focal deficits. Sensation and motor function intact. Gait is steady.   ED Results / Procedures / Treatments   Labs (all labs ordered are listed, but only abnormal results are displayed) Labs Reviewed - No data to display  No results  found.  PROCEDURES:  Critical Care performed: No  Procedures   MEDICATIONS ORDERED IN ED: Medications  ketorolac (TORADOL) 30 MG/ML injection 30 mg (30 mg Intramuscular Given 07/28/23 1741)  dexamethasone (DECADRON) injection 10 mg (10 mg Intramuscular Given 07/28/23 1744)  cyclobenzaprine (FLEXERIL) tablet 5 mg (5 mg Oral Given 07/28/23 1838)     IMPRESSION / MDM / ASSESSMENT AND PLAN / ED COURSE  I reviewed the triage vital signs and the nursing notes.                               47 y.o. male presents to the emergency department for evaluation and treatment of chronic back pain without injury. See HPI for further details.   Differential diagnosis includes, but is not limited to lumbar radiculopathy, arthritis, muscle strain   Patient's presentation is most consistent with exacerbation of chronic illness.  Patient treated with Toradol, Decadron and a lidocaine patch.  Some improvement reported by patient.  Patient then given  Flexeril.  Wife will be taking patient home.  Patient presentation is benign with physical exam findings.  He is stable for discharge and outpatient follow-up.  Patient is encouraged to follow-up with orthopedics.  A prescription for Flexeril and short course of Toradol is provided. Patient is given ED precautions to return to the ED for any worsening or new symptoms. Patient verbalizes understanding. All questions and concerns were addressed during ED visit.     FINAL CLINICAL IMPRESSION(S) / ED DIAGNOSES   Final diagnoses:  Chronic low back pain without sciatica, unspecified back pain laterality   Rx / DC Orders   ED Discharge Orders          Ordered    cyclobenzaprine (FLEXERIL) 10 MG tablet  3 times daily PRN        07/28/23 1835    ketorolac (TORADOL) 10 MG tablet  Every 8 hours PRN        07/28/23 1835             Note:  This document was prepared using Dragon voice recognition software and may include unintentional dictation errors.     Romeo Apple, Veyda Kaufman A, PA-C 07/29/23 1628    Pilar Jarvis, MD 07/29/23 1739

## 2023-07-28 NOTE — Discharge Instructions (Addendum)
Please follow up with orthopedics for further evaluation and management.

## 2023-07-31 ENCOUNTER — Other Ambulatory Visit: Payer: Self-pay | Admitting: Cardiovascular Disease

## 2023-08-01 DIAGNOSIS — M5416 Radiculopathy, lumbar region: Secondary | ICD-10-CM | POA: Diagnosis not present

## 2023-08-01 DIAGNOSIS — M5442 Lumbago with sciatica, left side: Secondary | ICD-10-CM | POA: Diagnosis not present

## 2023-08-01 DIAGNOSIS — M5136 Other intervertebral disc degeneration, lumbar region: Secondary | ICD-10-CM | POA: Diagnosis not present

## 2023-08-01 DIAGNOSIS — G8929 Other chronic pain: Secondary | ICD-10-CM | POA: Diagnosis not present

## 2023-08-01 NOTE — Telephone Encounter (Signed)
Please contact pt for future appointment. Pt due for 6 month f/u. 

## 2023-09-01 ENCOUNTER — Other Ambulatory Visit: Payer: Self-pay | Admitting: Cardiovascular Disease

## 2023-09-13 DIAGNOSIS — Z0000 Encounter for general adult medical examination without abnormal findings: Secondary | ICD-10-CM | POA: Diagnosis not present

## 2023-09-13 DIAGNOSIS — Z131 Encounter for screening for diabetes mellitus: Secondary | ICD-10-CM | POA: Diagnosis not present

## 2023-09-13 DIAGNOSIS — Z125 Encounter for screening for malignant neoplasm of prostate: Secondary | ICD-10-CM | POA: Diagnosis not present

## 2023-09-13 DIAGNOSIS — Z79899 Other long term (current) drug therapy: Secondary | ICD-10-CM | POA: Diagnosis not present

## 2023-09-13 DIAGNOSIS — I2510 Atherosclerotic heart disease of native coronary artery without angina pectoris: Secondary | ICD-10-CM | POA: Diagnosis not present

## 2023-09-13 DIAGNOSIS — I10 Essential (primary) hypertension: Secondary | ICD-10-CM | POA: Diagnosis not present

## 2023-09-13 DIAGNOSIS — E7800 Pure hypercholesterolemia, unspecified: Secondary | ICD-10-CM | POA: Diagnosis not present

## 2023-09-13 DIAGNOSIS — Z1331 Encounter for screening for depression: Secondary | ICD-10-CM | POA: Diagnosis not present

## 2023-09-18 DIAGNOSIS — L538 Other specified erythematous conditions: Secondary | ICD-10-CM | POA: Diagnosis not present

## 2023-09-18 DIAGNOSIS — R208 Other disturbances of skin sensation: Secondary | ICD-10-CM | POA: Diagnosis not present

## 2023-09-18 DIAGNOSIS — D485 Neoplasm of uncertain behavior of skin: Secondary | ICD-10-CM | POA: Diagnosis not present

## 2023-09-18 DIAGNOSIS — L820 Inflamed seborrheic keratosis: Secondary | ICD-10-CM | POA: Diagnosis not present

## 2023-11-06 ENCOUNTER — Other Ambulatory Visit: Payer: Self-pay | Admitting: Cardiovascular Disease

## 2023-11-06 NOTE — Telephone Encounter (Signed)
Hi,  Could you schedule this patient an overdue 6 month follow up visit? The patient was last seen on 01-16-2023. Thank you so much.

## 2023-11-07 NOTE — Telephone Encounter (Signed)
Last office visit 01/16/23 with plan to f/u in 6 months next office visit: none/active recall

## 2023-11-08 ENCOUNTER — Telehealth: Payer: Self-pay

## 2023-11-08 NOTE — Telephone Encounter (Signed)
Left message for patient to call for medication clarification

## 2023-11-08 NOTE — Telephone Encounter (Signed)
Fax received from [facility] for clarification of possible therapeutic duplication.  Refill request received and sent for Eplerenone 25 mg every day prescribed by Dr. Kirke Corin.  Recently filled rx of Spironolactone 25 mg 1 every day prescribed by Dr. Larwance Sachs on 09/13/23.

## 2023-11-18 ENCOUNTER — Telehealth: Payer: Self-pay | Admitting: Cardiovascular Disease

## 2023-11-18 NOTE — Telephone Encounter (Signed)
Called placed to the patient to verify what the patient is taking. He stated that he is taking Spironolactone 25 mg once daily (prescribed by PCP-Dr. Larwance Sachs), hydrochlorothiazide 25 mg once daily and the Eplerenone 25 mg once daily.   Call placed to [facility] and they have verified that the patient has been getting refills on all there.   Call placed to Dr. Pilar Plate office. They will speak to him and call back with his recommendations.

## 2023-11-18 NOTE — Telephone Encounter (Signed)
Pt c/o medication issue:  1. Name of Medication: eplerenone (INSPRA) 25 MG tablet   2. How are you currently taking this medication (dosage and times per day)?    3. Are you having a reaction (difficulty breathing--STAT)? no  4. What is your medication issue? Pharmacy called to say that patient has a refill in for spironolactone. seeing if they should just cancel the above medication out. Pleas advise

## 2023-11-18 NOTE — Telephone Encounter (Signed)
Dr. Pilar Plate office called back and he stated he was fine with discontinuing either the spironolactone or eplerenone but the spironolactone was cheaper for the patient.

## 2023-11-21 NOTE — Telephone Encounter (Signed)
He used to be on spironolactone and was switched to eplerenone due to nipple tenderness.  If he is not having that issue anymore, I am fine with him continuing spironolactone and discontinuing eplerenone.

## 2023-11-21 NOTE — Telephone Encounter (Signed)
The patient has been made aware to stop the Eplerenone and stay on the Spironolactone.  [facility] has been notified and his medication chart has been updated.

## 2024-01-30 ENCOUNTER — Encounter: Payer: Self-pay | Admitting: Nurse Practitioner

## 2024-01-30 ENCOUNTER — Ambulatory Visit: Payer: BC Managed Care – PPO | Attending: Nurse Practitioner | Admitting: Nurse Practitioner

## 2024-01-30 VITALS — BP 120/86 | HR 81 | Ht 70.0 in | Wt 232.4 lb

## 2024-01-30 DIAGNOSIS — Z720 Tobacco use: Secondary | ICD-10-CM

## 2024-01-30 DIAGNOSIS — I10 Essential (primary) hypertension: Secondary | ICD-10-CM | POA: Diagnosis not present

## 2024-01-30 DIAGNOSIS — E785 Hyperlipidemia, unspecified: Secondary | ICD-10-CM

## 2024-01-30 DIAGNOSIS — R7303 Prediabetes: Secondary | ICD-10-CM

## 2024-01-30 DIAGNOSIS — I2510 Atherosclerotic heart disease of native coronary artery without angina pectoris: Secondary | ICD-10-CM

## 2024-01-30 NOTE — Progress Notes (Signed)
Office Visit    Patient Name: George Welch Date of Encounter: 01/30/2024  Primary Care Provider:  Nira Welch Primary Cardiologist:  George Bears, MD  Chief Complaint    48 y.o. male with a history of CAD, diastolic dysfunction, hypertension, hyperlipidemia, and tobacco/marijuana use, who presents for CAD follow-up.  Past Medical History  Subjective   Past Medical History:  Diagnosis Date   Coronary artery disease 08/2015   a. 08/2015 NSTEMI/PCI: Sev mLCX (DES) & m/d RCA (DES x 3) dzs. OM 2 100 w. collats.    Diastolic dysfunction    a. 08/2015 Echo: EF 55-60%, Gr1 DD.    Drug abuse (HCC)    a. THC   Hyperlipidemia    Hypertension    MI (myocardial infarction) (HCC)    Obesity    Stented coronary artery    Past Surgical History:  Procedure Laterality Date   CARDIAC CATHETERIZATION N/A 08/26/2015   Procedure: Left Heart Cath and Coronary Angiography;  Surgeon: George Iba, MD;  Location: [facility] INVASIVE CV LAB;  Service: Cardiovascular;  Laterality: N/A;   CARDIAC CATHETERIZATION N/A 08/26/2015   Procedure: Coronary Stent Intervention;  Surgeon: George Pea, MD;  Location: [facility] INVASIVE CV LAB;  Service: Cardiovascular;  Laterality: N/A;    Allergies  Allergies  Allergen Reactions   Lisinopril Swelling    Angioedema  Around lips  Around lips    Angioedema Around lips      History of Present Illness      48 y.o. y/o male with the above past medical history including CAD status post non-STEMI in September 2016 with PCI and drug-eluting stent placement to the left circumflex (DES x 1) and RCA (DES x 3).  The second obtuse marginal was noted to be occluded and fills via collaterals.  Echo showed normal LV function.  Other history includes hypertension, hyperlipidemia, diastolic dysfunction, tobacco/marijuana use, and obesity.  He experienced angioedema on lisinopril therapy and this was subsequently discontinued.  Spironolactone was switched to  eplerenone due to nipple tenderness with improvement in symptoms.   George Welch was last seen in cardiology clinic in January 2024, at which time he was doing well and reported compliance with his medications.  Repeat labs at that time showed significant improvement in LDL to 64 with normal LFTs.  Over the past year, George Welch says he has been doing well.  He does not routinely exercise.  Says he stays busy with work and his grandchildren.  He continues to smoke a half a pack a day.  He is not currently contemplating quitting.  He denies marijuana use.  He denies chest pain, dyspnea, palpitations, PND, orthopnea, dizziness, syncope, edema, or early satiety. Objective  Home Medications    Current Outpatient Medications  Medication Sig Dispense Refill   amLODipine (NORVASC) 10 MG tablet TAKE 1 TABLET BY MOUTH ONCE DAILY 90 tablet 2   aspirin 81 MG chewable tablet Chew 1 tablet (81 mg total) by mouth daily. 30 tablet 3   atorvastatin (LIPITOR) 80 MG tablet Take 1 tablet by mouth once daily 30 tablet 0   carvedilol (COREG) 25 MG tablet Take 1 tablet (25 mg total) by mouth 2 (two) times daily with a meal. 180 tablet 2   clopidogrel (PLAVIX) 75 MG tablet Take 1 tablet (75 mg total) by mouth daily. 90 tablet 2   cyclobenzaprine (FLEXERIL) 10 MG tablet Take 1 tablet (10 mg total) by mouth 3 (three) times daily as needed for muscle  spasms. 30 tablet 0   ezetimibe (ZETIA) 10 MG tablet Take 1 tablet (10 mg total) by mouth daily. 90 tablet 2   hydrochlorothiazide (HYDRODIURIL) 25 MG tablet Take 1 tablet (25 mg total) by mouth daily. PLEASE CALL OFFICE TO SCHEDULE APPOINTMENT PRIOR TO NEXT REFILL 30 tablet 0   ketorolac (TORADOL) 10 MG tablet Take 1 tablet (10 mg total) by mouth every 8 (eight) hours as needed. 10 tablet 0   nitroGLYCERIN (NITROSTAT) 0.4 MG SL tablet Place 1 tablet (0.4 mg total) under the tongue every 5 (five) minutes as needed for chest pain. 25 tablet 0   sildenafil (VIAGRA) 50 MG tablet Take 1  tablet by mouth daily as needed.     spironolactone (ALDACTONE) 25 MG tablet Take 25 mg by mouth daily.     No current facility-administered medications for this visit.     Physical Exam    VS:  BP 120/86 (BP Location: Left Arm, Patient Position: Sitting, Cuff Size: Normal)   Pulse 81   Ht 5\' 10"  (1.778 m)   Wt 232 lb 6 oz (105.4 kg)   SpO2 97%   BMI 33.34 kg/m  , BMI Body mass index is 33.34 kg/m.       GEN: Well nourished, well developed, in no acute distress. HEENT: normal. Neck: Supple, no JVD, carotid bruits, or masses. Cardiac: RRR, no murmurs, rubs, or gallops. No clubbing, cyanosis, edema.  Radials 2+/PT 2+ and equal bilaterally.  Respiratory:  Respirations regular and unlabored, scattered rhonchi bilaterally. GI: Soft, nontender, nondistended, BS + x 4. MS: no deformity or atrophy. Skin: warm and dry, no rash. Neuro:  Strength and sensation are intact. Psych: Normal affect.  Accessory Clinical Findings    ECG personally reviewed by me today - EKG Interpretation Date/Time:  Thursday January 30 2024 15:53:19 EST Ventricular Rate:  81 PR Interval:  170 QRS Duration:  90 QT Interval:  364 QTC Calculation: 422 R Axis:   -19  Text Interpretation: Normal sinus rhythm Normal ECG Confirmed by George Welch [phone]) on 01/30/2024 3:57:07 PM  - no acute changes.  Labs dated September 13, 2023 from [facility]  Hemoglobin 15.6, hematocrit 48.2, WBC 4.2, platelets 214 Sodium 136, potassium 4.3, chloride 104, CO2 23.3, BUN 15, creatinine 1.0, glucose 96 Calcium 9.7, albumin 4.4, total protein 7.3 Total bilirubin 0.4, alkaline phosphatase 63, AST 22, ALT 25 Hemoglobin A1c 6.2 Total cholesterol 248, triglycerides 244, HDL 36.1, LDL 163 PSA 1.27    Assessment & Plan    1.  Coronary artery disease: Status post non-STEMI in September 2016 with PCI during stent placement to left circumflex (x 1), and RCA (x 3).  He has done well over the past year without chest  pain or dyspnea.  He is not exercising and I strongly encouraged him to achieve 30 minutes of moderate aerobic activity at least 5 days a week with 2 to 3 days of strength training.  His wife's employer recently built a gym at their site, which she will have access to, and his family is considering joining.  He remains on lifelong dual antiplatelet therapy, beta-blocker, statin, and Zetia.  2.  Primary hypertension: Stable on carvedilol, amlodipine, HCTZ, and spironolactone.  Renal function electrolytes stable in September.  3.  Hyperlipidemia: Patient says he came off of his statin and Zetia at some point during the year in 2024, and LDL was up to 163 in September 2024.  He says he has since resumed atorvastatin and Zetia.  When lipids were measured on therapy in January 2024, LDL was 64.  Strongly encouraged him to remain on prescribed medications.  4.  Tobacco abuse: Continues to smoke half a pack of cigarettes a day.  He is not currently contemplating quitting.  Cessation advised.  He denies marijuana use.  5.  Prediabetes: A1c 6.2 in September.  Discussed the importance of regular exercise, avoidance of processed and high caloric foods, and weight loss.  As above, he is considering joining a gym.  6.  Disposition: Follow-up in 1 year or sooner if necessary.  George Ducking, NP 01/30/2024, 3:57 PM

## 2024-01-30 NOTE — Patient Instructions (Signed)
Medication Instructions:  No changes *If you need a refill on your cardiac medications before your next appointment, please call your pharmacy*   Lab Work: None ordered If you have labs (blood work) drawn today and your tests are completely normal, you will receive your results only by: MyChart Message (if you have MyChart) OR A paper copy in the mail If you have any lab test that is abnormal or we need to change your treatment, we will call you to review the results.   Testing/Procedures: None ordered   Follow-Up: At [facility], you and your health needs are our priority.  As part of our continuing mission to provide you with exceptional heart care, we have created designated Provider Care Teams.  These Care Teams include your primary Cardiologist (physician) and Advanced Practice Providers (APPs -  Physician Assistants and Nurse Practitioners) who all work together to provide you with the care you need, when you need it.  We recommend signing up for the patient portal called "MyChart".  Sign up information is provided on this After Visit Summary.  MyChart is used to connect with patients for Virtual Visits (Telemedicine).  Patients are able to view lab/test results, encounter notes, upcoming appointments, etc.  Non-urgent messages can be sent to your provider as well.   To learn more about what you can do with MyChart, go to [email or web address].    Your next appointment:   12 month(s)  Provider:   You may see Lorine Bears, MD or one of the following Advanced Practice Providers on your designated Care Team:   Nicolasa Ducking, NP

## 2024-03-16 ENCOUNTER — Other Ambulatory Visit: Payer: Self-pay | Admitting: Cardiovascular Disease
